# Patient Record
Sex: Male | Born: 1955 | Race: White | Hispanic: No | Marital: Single | Smoking: Former smoker
Health system: Southern US, Community
[De-identification: ages and names within clinical notes are randomized; demographics above are authoritative.]

## PROBLEM LIST (undated history)

## (undated) DIAGNOSIS — F209 Schizophrenia, unspecified: Secondary | ICD-10-CM

## (undated) DIAGNOSIS — F32A Depression, unspecified: Secondary | ICD-10-CM

## (undated) DIAGNOSIS — F329 Major depressive disorder, single episode, unspecified: Secondary | ICD-10-CM

## (undated) DIAGNOSIS — E785 Hyperlipidemia, unspecified: Secondary | ICD-10-CM

## (undated) HISTORY — DX: Schizophrenia, unspecified: F20.9

## (undated) HISTORY — DX: Major depressive disorder, single episode, unspecified: F32.9

## (undated) HISTORY — DX: Depression, unspecified: F32.A

## (undated) HISTORY — DX: Hyperlipidemia, unspecified: E78.5

## (undated) HISTORY — PX: OTHER SURGICAL HISTORY: SHX169

## (undated) HISTORY — PX: COLONOSCOPY: SHX174

---

## 2007-01-25 ENCOUNTER — Ambulatory Visit: Payer: Self-pay | Admitting: Internal Medicine

## 2007-02-17 ENCOUNTER — Ambulatory Visit: Payer: Self-pay | Admitting: Internal Medicine

## 2007-02-17 ENCOUNTER — Encounter: Payer: Self-pay | Admitting: Internal Medicine

## 2007-02-17 DIAGNOSIS — D126 Benign neoplasm of colon, unspecified: Secondary | ICD-10-CM

## 2007-02-17 DIAGNOSIS — K5730 Diverticulosis of large intestine without perforation or abscess without bleeding: Secondary | ICD-10-CM

## 2007-09-28 DIAGNOSIS — F411 Generalized anxiety disorder: Secondary | ICD-10-CM | POA: Insufficient documentation

## 2007-09-28 DIAGNOSIS — E7800 Pure hypercholesterolemia, unspecified: Secondary | ICD-10-CM | POA: Insufficient documentation

## 2007-09-28 DIAGNOSIS — F32A Depression, unspecified: Secondary | ICD-10-CM | POA: Insufficient documentation

## 2007-09-28 DIAGNOSIS — F329 Major depressive disorder, single episode, unspecified: Secondary | ICD-10-CM

## 2007-09-28 DIAGNOSIS — Z8659 Personal history of other mental and behavioral disorders: Secondary | ICD-10-CM

## 2010-02-03 ENCOUNTER — Encounter (INDEPENDENT_AMBULATORY_CARE_PROVIDER_SITE_OTHER): Payer: Self-pay | Admitting: *Deleted

## 2010-07-22 NOTE — Letter (Signed)
Summary: Colonoscopy Letter  [facility]  [address] [place], [place] [address]   Phone: [phone]  Fax: [phone]      February 03, 2010 MRN: [id number]   [facility] [address] [place], [place]  [address]   Dear Mr. Frisbie,   According to your medical record, it is time for you to schedule a Colonoscopy. The American Cancer Society recommends this procedure as a method to detect early colon cancer. Patients with a family history of colon cancer, or a personal history of colon polyps or inflammatory bowel disease are at increased risk.  This letter has beeen generated based on the recommendations made at the time of your procedure. If you feel that in your particular situation this may no longer apply, please contact our office.  Please call our office at [phone] to schedule this appointment or to update your records at your earliest convenience.  Thank you for cooperating with [place] to provide you with the very best care possible.   Sincerely,   Iva Boop, M.D.  [facility] [phone]

## 2012-08-31 ENCOUNTER — Ambulatory Visit: Payer: Medicare Other | Attending: Podiatry | Admitting: Physical Therapy

## 2012-08-31 DIAGNOSIS — M25579 Pain in unspecified ankle and joints of unspecified foot: Secondary | ICD-10-CM | POA: Insufficient documentation

## 2012-08-31 DIAGNOSIS — R5381 Other malaise: Secondary | ICD-10-CM | POA: Insufficient documentation

## 2012-08-31 DIAGNOSIS — IMO0001 Reserved for inherently not codable concepts without codable children: Secondary | ICD-10-CM | POA: Insufficient documentation

## 2012-09-02 ENCOUNTER — Ambulatory Visit: Payer: Medicare Other | Admitting: Physical Therapy

## 2012-09-05 ENCOUNTER — Ambulatory Visit: Payer: Medicare Other | Admitting: Physical Therapy

## 2012-09-07 ENCOUNTER — Ambulatory Visit: Payer: Medicare Other | Admitting: Physical Therapy

## 2012-09-12 ENCOUNTER — Ambulatory Visit: Payer: Medicare Other | Admitting: *Deleted

## 2012-09-14 ENCOUNTER — Ambulatory Visit: Payer: Medicare Other | Admitting: *Deleted

## 2012-11-18 ENCOUNTER — Ambulatory Visit (INDEPENDENT_AMBULATORY_CARE_PROVIDER_SITE_OTHER): Payer: Medicare Other | Admitting: Physician Assistant

## 2012-11-18 ENCOUNTER — Encounter: Payer: Self-pay | Admitting: Physician Assistant

## 2012-11-18 VITALS — BP 116/81 | HR 78 | Temp 96.5°F | Ht 67.0 in | Wt 196.0 lb

## 2012-11-18 DIAGNOSIS — H6120 Impacted cerumen, unspecified ear: Secondary | ICD-10-CM

## 2012-11-18 DIAGNOSIS — H6123 Impacted cerumen, bilateral: Secondary | ICD-10-CM

## 2012-11-18 NOTE — Patient Instructions (Signed)
Cerumen Plug A cerumen plug is having too much wax in your ear canal. The outer ear canal is lined with hairs and glands that secrete wax. This wax is called cerumen. This protects the ear canal. It also helps prevent material from entering the ear. Too much wax can cause a feeling of fullness in the ears, decreased hearing, ringing in the ears, or an earache. Sometimes your caregiver will remove a cerumen plug with an instrument called a curette. Or he/she may flush the ear canal with warm water from a syringe to remove the wax. You may simply be sent home to follow the home care instructions below for wax removal. Generally ear wax does not have to be removed unless it is causing a problem such as one of those listed above. When too much wax is causing a problem, the following are a few home remedies which can be used to help this problem. HOME CARE INSTRUCTIONS   Put a couple drops of glycerin, baby oil, or mineral oil in the ear a couple times of day. Do this every day for several days. After putting the drops in, you will need to lay with the affected ear pointing up for a couple minutes. This allows the drops to remain in the canal and run down to the area of wax blockage. This will soften the wax plug. It may also make your hearing worse as the wax softens and blocks the canal even more.  After a couple days, you may gently flush the ear canal with warm water from a syringe. Do this by pulling your ear up and back with your head tilted slightly forward and towards a pan to catch the water. This is most easily done with a helper. You can also accomplish the same thing by letting the shower beat into your ear canal to wash the wax out. Sometimes this will not be immediately successful. You will have to return to the first step of using the oil to further soften the wax. Then resume washing the ear canal out with a syringe or shower.  Following removal of the wax, put ten to twenty drops of rubbing  alcohol into the outer ears. This will dry the canal and prevent an infection.  Do not irrigate or wash out your ears if you have had a perforated ear drum or mastoid surgery. SEEK IMMEDIATE MEDICAL CARE IF:   You are unsuccessful with the above instructions for home care.  You develop ear pain or drainage from the ear. MAKE SURE YOU:   Understand these instructions.  Will watch your condition.  Will get help right away if you are not doing well or get worse. Document Released: 03/03/2001 Document Revised: 08/31/2011 Document Reviewed: 05/30/2008 ExitCare Patient Information 2014 [employer].  

## 2012-11-18 NOTE — Progress Notes (Signed)
Subjective:     Patient ID: FIN HUPP, male   DOB: 1956-06-14, 57 y.o.   MRN: [id number]  HPI Pt with fullness and decreased hearing bilat Denies pain or drainage from the ears + Hx of cerumen impaction  Review of Systems  All other systems reviewed and are negative.       Objective:   Physical Exam  Nursing note and vitals reviewed.  Ext ear nl bilat Both canals completely impacted with cerumen Both ears irrigated with removal of large cerumen plugs Following irrigation canals/TM's nl bilat    Assessment:     1. Impacted cerumen of both ears        Plan:     No Qtip use OTC ear wax softener monthly F/U prn

## 2012-12-07 ENCOUNTER — Other Ambulatory Visit (INDEPENDENT_AMBULATORY_CARE_PROVIDER_SITE_OTHER): Payer: Medicare Other

## 2012-12-07 DIAGNOSIS — R5381 Other malaise: Secondary | ICD-10-CM

## 2012-12-07 DIAGNOSIS — F209 Schizophrenia, unspecified: Secondary | ICD-10-CM

## 2012-12-07 DIAGNOSIS — R5383 Other fatigue: Secondary | ICD-10-CM

## 2012-12-07 DIAGNOSIS — R7989 Other specified abnormal findings of blood chemistry: Secondary | ICD-10-CM

## 2012-12-07 DIAGNOSIS — Z79899 Other long term (current) drug therapy: Secondary | ICD-10-CM

## 2012-12-07 DIAGNOSIS — E785 Hyperlipidemia, unspecified: Secondary | ICD-10-CM

## 2012-12-07 LAB — CBC WITH DIFFERENTIAL/PLATELET
Basophils Absolute: 0.1 10*3/uL (ref 0.0–0.1)
Eosinophils Absolute: 0.1 10*3/uL (ref 0.0–0.7)
Eosinophils Relative: 2 % (ref 0–5)
Lymphs Abs: 1.2 10*3/uL (ref 0.7–4.0)
MCH: 30 pg (ref 26.0–34.0)
MCHC: 35.6 g/dL (ref 30.0–36.0)
MCV: 84.2 fL (ref 78.0–100.0)
Platelets: 190 10*3/uL (ref 150–400)
RDW: 14.4 % (ref 11.5–15.5)

## 2012-12-07 LAB — COMPREHENSIVE METABOLIC PANEL
ALT: 17 U/L (ref 0–53)
CO2: 28 mEq/L (ref 19–32)
Creat: 1.36 mg/dL — ABNORMAL HIGH (ref 0.50–1.35)
Total Bilirubin: 0.6 mg/dL (ref 0.3–1.2)

## 2012-12-07 LAB — THYROID PANEL WITH TSH: T3 Uptake: 42.1 % — ABNORMAL HIGH (ref 22.5–37.0)

## 2012-12-07 LAB — LIPID PANEL
Cholesterol: 180 mg/dL (ref 0–200)
Total CHOL/HDL Ratio: 4.9 Ratio
VLDL: 45 mg/dL — ABNORMAL HIGH (ref 0–40)

## 2012-12-07 NOTE — Progress Notes (Signed)
Labs ordered by dr.lisa poulos @ [facility] fax [phone]

## 2012-12-08 LAB — URINALYSIS
Bilirubin Urine: NEGATIVE
Hgb urine dipstick: NEGATIVE
Ketones, ur: NEGATIVE mg/dL
Protein, ur: NEGATIVE mg/dL
Urobilinogen, UA: 0.2 mg/dL (ref 0.0–1.0)

## 2013-12-12 ENCOUNTER — Other Ambulatory Visit (INDEPENDENT_AMBULATORY_CARE_PROVIDER_SITE_OTHER): Payer: Medicare Other

## 2013-12-12 DIAGNOSIS — E7800 Pure hypercholesterolemia, unspecified: Secondary | ICD-10-CM

## 2013-12-12 DIAGNOSIS — Z79899 Other long term (current) drug therapy: Secondary | ICD-10-CM

## 2013-12-12 NOTE — Progress Notes (Signed)
Pt came in for labs for Dr. Noemi Chapel fax [phone]

## 2013-12-13 LAB — VITAMIN B12: VITAMIN B 12: 894 pg/mL (ref 211–946)

## 2013-12-13 LAB — CBC WITH DIFFERENTIAL
BASOS ABS: 0.1 10*3/uL (ref 0.0–0.2)
Basos: 2 %
Eos: 3 %
Eosinophils Absolute: 0.2 10*3/uL (ref 0.0–0.4)
HCT: 49.1 % (ref 37.5–51.0)
Hemoglobin: 16.7 g/dL (ref 12.6–17.7)
IMMATURE GRANS (ABS): 0 10*3/uL (ref 0.0–0.1)
IMMATURE GRANULOCYTES: 0 %
LYMPHS ABS: 1.3 10*3/uL (ref 0.7–3.1)
LYMPHS: 20 %
MCH: 29.9 pg (ref 26.6–33.0)
MCHC: 34 g/dL (ref 31.5–35.7)
MCV: 88 fL (ref 79–97)
MONOCYTES: 8 %
Monocytes Absolute: 0.5 10*3/uL (ref 0.1–0.9)
NEUTROS PCT: 67 %
Neutrophils Absolute: 4.5 10*3/uL (ref 1.4–7.0)
PLATELETS: 228 10*3/uL (ref 150–379)
RBC: 5.58 x10E6/uL (ref 4.14–5.80)
RDW: 14.4 % (ref 12.3–15.4)
WBC: 6.6 10*3/uL (ref 3.4–10.8)

## 2013-12-13 LAB — CMP14+EGFR
ALBUMIN: 4.5 g/dL (ref 3.5–5.5)
ALK PHOS: 91 IU/L (ref 39–117)
ALT: 24 IU/L (ref 0–44)
AST: 19 IU/L (ref 0–40)
Albumin/Globulin Ratio: 2 (ref 1.1–2.5)
BUN / CREAT RATIO: 11 (ref 9–20)
BUN: 15 mg/dL (ref 6–24)
CALCIUM: 9.2 mg/dL (ref 8.7–10.2)
CHLORIDE: 97 mmol/L (ref 97–108)
CO2: 26 mmol/L (ref 18–29)
Creatinine, Ser: 1.35 mg/dL — ABNORMAL HIGH (ref 0.76–1.27)
GFR calc Af Amer: 67 mL/min/{1.73_m2} (ref >59)
GFR calc non Af Amer: 58 mL/min/{1.73_m2} — ABNORMAL LOW (ref >59)
Globulin, Total: 2.3 g/dL (ref 1.5–4.5)
Glucose: 93 mg/dL (ref 65–99)
Potassium: 4.7 mmol/L (ref 3.5–5.2)
Sodium: 139 mmol/L (ref 134–144)
Total Bilirubin: 0.5 mg/dL (ref 0.0–1.2)
Total Protein: 6.8 g/dL (ref 6.0–8.5)

## 2013-12-13 LAB — LIPID PANEL
CHOLESTEROL TOTAL: 216 mg/dL — AB (ref 100–199)
Chol/HDL Ratio: 6.2 ratio units — ABNORMAL HIGH (ref 0.0–5.0)
HDL: 35 mg/dL — ABNORMAL LOW (ref >39)
LDL CALC: 116 mg/dL — AB (ref 0–99)
TRIGLYCERIDES: 326 mg/dL — AB (ref 0–149)
VLDL Cholesterol Cal: 65 mg/dL — ABNORMAL HIGH (ref 5–40)

## 2013-12-13 LAB — LDL CHOLESTEROL, DIRECT: LDL DIRECT: 130 mg/dL — AB (ref 0–99)

## 2013-12-13 LAB — THYROID PANEL WITH TSH
FREE THYROXINE INDEX: 2.3 (ref 1.2–4.9)
T3 UPTAKE RATIO: 31 % (ref 24–39)
T4, Total: 7.3 ug/dL (ref 4.5–12.0)
TSH: 3.01 u[IU]/mL (ref 0.450–4.500)

## 2013-12-13 LAB — VITAMIN D 25 HYDROXY (VIT D DEFICIENCY, FRACTURES): Vit D, 25-Hydroxy: 25 ng/mL — ABNORMAL LOW (ref 30.0–100.0)

## 2017-02-10 ENCOUNTER — Other Ambulatory Visit: Payer: Self-pay

## 2019-04-03 ENCOUNTER — Ambulatory Visit: Payer: Self-pay | Admitting: Family Medicine

## 2019-05-03 ENCOUNTER — Other Ambulatory Visit: Payer: Self-pay

## 2019-05-04 ENCOUNTER — Ambulatory Visit (INDEPENDENT_AMBULATORY_CARE_PROVIDER_SITE_OTHER): Payer: Medicare Other | Admitting: Family Medicine

## 2019-05-04 ENCOUNTER — Encounter: Payer: Self-pay | Admitting: Family Medicine

## 2019-05-04 VITALS — BP 115/79 | HR 77 | Temp 97.4°F | Ht 67.0 in | Wt 181.4 lb

## 2019-05-04 DIAGNOSIS — Z1159 Encounter for screening for other viral diseases: Secondary | ICD-10-CM

## 2019-05-04 DIAGNOSIS — Z125 Encounter for screening for malignant neoplasm of prostate: Secondary | ICD-10-CM

## 2019-05-04 DIAGNOSIS — K5730 Diverticulosis of large intestine without perforation or abscess without bleeding: Secondary | ICD-10-CM

## 2019-05-04 DIAGNOSIS — F250 Schizoaffective disorder, bipolar type: Secondary | ICD-10-CM

## 2019-05-04 DIAGNOSIS — Z0001 Encounter for general adult medical examination with abnormal findings: Secondary | ICD-10-CM | POA: Diagnosis not present

## 2019-05-04 DIAGNOSIS — E7800 Pure hypercholesterolemia, unspecified: Secondary | ICD-10-CM

## 2019-05-04 DIAGNOSIS — Z114 Encounter for screening for human immunodeficiency virus [HIV]: Secondary | ICD-10-CM

## 2019-05-04 DIAGNOSIS — E559 Vitamin D deficiency, unspecified: Secondary | ICD-10-CM

## 2019-05-04 NOTE — Progress Notes (Signed)
Established Patient Office Visit  Subjective:  Patient ID: Kyle Arellano, male    DOB: Jan 17, 1956  Age: 63 y.o. MRN: [id number]  CC:  Chief Complaint  Patient presents with  . New Patient (Initial Visit)  . Establish Care    HPI Kyle Arellano presents for NEw patient evaluation and check up he is under the care of psychiatry, Dr. Noemi Chapel for schizoaffective disorder. Denies any current issues. She manages his medication  Past Medical History:  Diagnosis Date  . Depression   . Hyperlipidemia   . Schizophrenia ([place])     History reviewed. No pertinent surgical history.  Family History  Problem Relation Age of Onset  . Hypertension Mother   . Cancer Father   . Cirrhosis Father   . Diabetes Father     Social History   Socioeconomic History  . Marital status: Single    Spouse name: Not on file  . Number of children: Not on file  . Years of education: Not on file  . Highest education level: Not on file  Occupational History  . Not on file  Social Needs  . Financial resource strain: Not on file  . Food insecurity    Worry: Not on file    Inability: Not on file  . Transportation needs    Medical: Not on file    Non-medical: Not on file  Tobacco Use  . Smoking status: Former Research scientist (life sciences)  . Smokeless tobacco: Never Used  Substance and Sexual Activity  . Alcohol use: Never    Frequency: Never  . Drug use: Never  . Sexual activity: Not on file  Lifestyle  . Physical activity    Days per week: Not on file    Minutes per session: Not on file  . Stress: Not on file  Relationships  . Social Herbalist on phone: Not on file    Gets together: Not on file    Attends religious service: Not on file    Active member of club or organization: Not on file    Attends meetings of clubs or organizations: Not on file    Relationship status: Not on file  . Intimate partner violence    Fear of current or ex partner: Not on file    Emotionally abused: Not on file     Physically abused: Not on file    Forced sexual activity: Not on file  Other Topics Concern  . Not on file  Social History Narrative  . Not on file    Outpatient Medications Prior to Visit  Medication Sig Dispense Refill  . benztropine (COGENTIN) 2 MG tablet Take 2 mg by mouth daily.    Marland Kitchen buPROPion (WELLBUTRIN SR) 150 MG 12 hr tablet Take 150 mg by mouth 2 (two) times daily.    . fluPHENAZine (PROLIXIN) 10 MG tablet Take 10 mg by mouth every morning.    . TRINTELLIX 10 MG TABS tablet Take 10 mg by mouth every morning.    . DULoxetine (CYMBALTA) 30 MG capsule Take 30 mg by mouth daily.    . fluPHENAZine (PROLIXIN) 5 MG tablet Take 2.5 mg by mouth 2 (two) times daily.    . temazepam (RESTORIL) 30 MG capsule Take 30 mg by mouth 2 (two) times daily as needed for sleep.     No facility-administered medications prior to visit.     No Known Allergies  ROS Review of Systems  Constitutional: Negative.   HENT: Negative.  Eyes: Negative for visual disturbance.  Respiratory: Negative for cough and shortness of breath.   Cardiovascular: Negative for chest pain and leg swelling.  Gastrointestinal: Negative for abdominal pain, diarrhea, nausea and vomiting.  Genitourinary: Negative for difficulty urinating.  Musculoskeletal: Negative for arthralgias and myalgias.  Skin: Negative for rash.  Neurological: Negative for headaches.  Psychiatric/Behavioral: Negative for sleep disturbance.      Objective:    Physical Exam  Constitutional: He is oriented to person, place, and time. He appears well-developed and well-nourished.  HENT:  Head: Normocephalic and atraumatic.  Mouth/Throat: Oropharynx is clear and moist.  Eyes: Pupils are equal, round, and reactive to light. EOM are normal.  Neck: Normal range of motion. No tracheal deviation present. No thyromegaly present.  Cardiovascular: Normal rate, regular rhythm and normal heart sounds. Exam reveals no gallop and no friction rub.  No  murmur heard. Pulmonary/Chest: Breath sounds normal. He has no wheezes. He has no rales.  Abdominal: Soft. He exhibits no mass. There is no abdominal tenderness.  Musculoskeletal: Normal range of motion.        General: No edema.  Neurological: He is alert and oriented to person, place, and time.  Skin: Skin is warm and dry.  Psychiatric: He has a normal mood and affect.    BP 115/79   Pulse 77   Temp (!) 97.4 F (36.3 C) (Temporal)   Ht 5\' 7"  (1.702 m)   Wt 181 lb 6.4 oz (82.3 kg)   SpO2 99%   BMI 28.41 kg/m  Wt Readings from Last 3 Encounters:  05/04/19 181 lb 6.4 oz (82.3 kg)  11/18/12 196 lb (88.9 kg)     Health Maintenance Due  Topic Date Due  . Hepatitis C Screening  02-21-1956  . HIV Screening  09/21/1971  . TETANUS/TDAP  09/21/1975  . COLONOSCOPY  02/16/2017    There are no preventive care reminders to display for this patient.  Lab Results  Component Value Date   TSH 3.010 12/12/2013   Lab Results  Component Value Date   WBC 6.6 12/12/2013   HGB 16.7 12/12/2013   HCT 49.1 12/12/2013   MCV 88 12/12/2013   PLT 228 12/12/2013   Lab Results  Component Value Date   NA 139 12/12/2013   K 4.7 12/12/2013   CO2 26 12/12/2013   GLUCOSE 93 12/12/2013   BUN 15 12/12/2013   CREATININE 1.35 (H) 12/12/2013   BILITOT 0.5 12/12/2013   ALKPHOS 91 12/12/2013   AST 19 12/12/2013   ALT 24 12/12/2013   PROT 6.8 12/12/2013   ALBUMIN 4.5 12/12/2013   CALCIUM 9.2 12/12/2013   Lab Results  Component Value Date   CHOL 216 (H) 12/12/2013   Lab Results  Component Value Date   HDL 35 (L) 12/12/2013   Lab Results  Component Value Date   LDLCALC 116 (H) 12/12/2013   Lab Results  Component Value Date   TRIG 326 (H) 12/12/2013   Lab Results  Component Value Date   CHOLHDL 6.2 (H) 12/12/2013   No results found for: HGBA1C    Assessment & Plan:   Problem List Items Addressed This Visit      Active Problems   HYPERCHOLESTEROLEMIA   Relevant Orders    CBC   CMP   Lipid   Diverticulosis of colon   Relevant Orders   CBC   CMP   Lipid    Other Visit Diagnoses    Schizoaffective disorder, bipolar type ([place])    -  Primary   Relevant Orders   CBC   Lipid   Need for hepatitis C screening test       Relevant Orders   Hepatitis C antibody   Screening for prostate cancer       Relevant Orders   PSA Total (Reflex To Free)   Vitamin D deficiency       Relevant Orders   VITAMIN D   Encounter for screening for HIV       Relevant Orders   HIV antibody      No orders of the defined types were placed in this encounter.   Follow-up: Return in about 1 year (around 05/03/2020).    Claretta Fraise, MD

## 2019-05-05 LAB — LIPID PANEL
Chol/HDL Ratio: 4.2 ratio (ref 0.0–5.0)
Cholesterol, Total: 181 mg/dL (ref 100–199)
HDL: 43 mg/dL (ref >39)
LDL Chol Calc (NIH): 109 mg/dL — ABNORMAL HIGH (ref 0–99)
Triglycerides: 164 mg/dL — ABNORMAL HIGH (ref 0–149)
VLDL Cholesterol Cal: 29 mg/dL (ref 5–40)

## 2019-05-05 LAB — CBC WITH DIFFERENTIAL/PLATELET
Basophils Absolute: 0.1 10*3/uL (ref 0.0–0.2)
Basos: 2 %
EOS (ABSOLUTE): 0.1 10*3/uL (ref 0.0–0.4)
Eos: 2 %
Hematocrit: 48.7 % (ref 37.5–51.0)
Hemoglobin: 16.4 g/dL (ref 13.0–17.7)
Immature Grans (Abs): 0 10*3/uL (ref 0.0–0.1)
Immature Granulocytes: 0 %
Lymphocytes Absolute: 1.3 10*3/uL (ref 0.7–3.1)
Lymphs: 18 %
MCH: 29.1 pg (ref 26.6–33.0)
MCHC: 33.7 g/dL (ref 31.5–35.7)
MCV: 87 fL (ref 79–97)
Monocytes Absolute: 0.5 10*3/uL (ref 0.1–0.9)
Monocytes: 8 %
Neutrophils Absolute: 5.1 10*3/uL (ref 1.4–7.0)
Neutrophils: 70 %
Platelets: 223 10*3/uL (ref 150–450)
RBC: 5.63 x10E6/uL (ref 4.14–5.80)
RDW: 14 % (ref 11.6–15.4)
WBC: 7.2 10*3/uL (ref 3.4–10.8)

## 2019-05-05 LAB — CMP14+EGFR
ALT: 31 IU/L (ref 0–44)
AST: 21 IU/L (ref 0–40)
Albumin/Globulin Ratio: 2.2 (ref 1.2–2.2)
Albumin: 4.6 g/dL (ref 3.8–4.8)
Alkaline Phosphatase: 72 IU/L (ref 39–117)
BUN/Creatinine Ratio: 16 (ref 10–24)
BUN: 20 mg/dL (ref 8–27)
Bilirubin Total: 0.6 mg/dL (ref 0.0–1.2)
CO2: 25 mmol/L (ref 20–29)
Calcium: 9.6 mg/dL (ref 8.6–10.2)
Chloride: 101 mmol/L (ref 96–106)
Creatinine, Ser: 1.24 mg/dL (ref 0.76–1.27)
GFR calc Af Amer: 72 mL/min/{1.73_m2} (ref >59)
GFR calc non Af Amer: 62 mL/min/{1.73_m2} (ref >59)
Globulin, Total: 2.1 g/dL (ref 1.5–4.5)
Glucose: 81 mg/dL (ref 65–99)
Potassium: 4.4 mmol/L (ref 3.5–5.2)
Sodium: 140 mmol/L (ref 134–144)
Total Protein: 6.7 g/dL (ref 6.0–8.5)

## 2019-05-05 LAB — HIV ANTIBODY (ROUTINE TESTING W REFLEX): HIV Screen 4th Generation wRfx: NONREACTIVE

## 2019-05-05 LAB — PSA TOTAL (REFLEX TO FREE): Prostate Specific Ag, Serum: 0.3 ng/mL (ref 0.0–4.0)

## 2019-05-05 LAB — HEPATITIS C ANTIBODY: Hep C Virus Ab: <0.1 s/co ratio (ref 0.0–0.9)

## 2019-05-05 LAB — VITAMIN D 25 HYDROXY (VIT D DEFICIENCY, FRACTURES): Vit D, 25-Hydroxy: 61.4 ng/mL (ref 30.0–100.0)

## 2019-05-08 NOTE — Progress Notes (Signed)
Hello Loring,  Your lab result is normal and/or stable.Some minor variations that are not significant are commonly marked abnormal, but do not represent any medical problem for you.  Best regards, Katelynn Heidler, M.D.

## 2019-11-12 DIAGNOSIS — H6120 Impacted cerumen, unspecified ear: Secondary | ICD-10-CM | POA: Diagnosis not present

## 2020-05-01 DIAGNOSIS — H1013 Acute atopic conjunctivitis, bilateral: Secondary | ICD-10-CM | POA: Diagnosis not present

## 2020-05-01 DIAGNOSIS — H40033 Anatomical narrow angle, bilateral: Secondary | ICD-10-CM | POA: Diagnosis not present

## 2020-09-02 ENCOUNTER — Encounter: Payer: Self-pay | Admitting: Family Medicine

## 2020-09-02 ENCOUNTER — Ambulatory Visit (INDEPENDENT_AMBULATORY_CARE_PROVIDER_SITE_OTHER): Payer: Medicare Other | Admitting: Family Medicine

## 2020-09-02 ENCOUNTER — Other Ambulatory Visit: Payer: Self-pay

## 2020-09-02 VITALS — BP 111/76 | HR 88 | Temp 98.2°F | Ht 67.0 in | Wt 200.6 lb

## 2020-09-02 DIAGNOSIS — Z79899 Other long term (current) drug therapy: Secondary | ICD-10-CM

## 2020-09-02 DIAGNOSIS — Z125 Encounter for screening for malignant neoplasm of prostate: Secondary | ICD-10-CM

## 2020-09-02 DIAGNOSIS — Z0000 Encounter for general adult medical examination without abnormal findings: Secondary | ICD-10-CM

## 2020-09-02 LAB — BAYER DCA HB A1C WAIVED: HB A1C (BAYER DCA - WAIVED): 5.4 % (ref <7.0)

## 2020-09-02 NOTE — Progress Notes (Signed)
Subjective:  Patient ID: Kyle Arellano, male    DOB: 08-23-1955  Age: 65 y.o. MRN: [id number]  CC: Annual Exam   HPI  Kyle Arellano presents for Annual Physical.   Depression screen Kyle Arellano 2/9 09/02/2020 05/04/2019  Decreased Interest 0 0  Down, Depressed, Hopeless 1 1  PHQ - 2 Score 1 1  Altered sleeping 0 -  Tired, decreased energy 0 -  Change in appetite 0 -  Feeling bad or failure about yourself  0 -  Trouble concentrating 2 -  Moving slowly or fidgety/restless 0 -  Suicidal thoughts 0 -  PHQ-9 Score 3 -  Difficult doing work/chores Not difficult at all -    History Kyle Arellano has a past medical history of Depression, Hyperlipidemia, and Schizophrenia ([place]).   Kyle Arellano has no past surgical history on file.   His family history includes Cancer in his father; Cirrhosis in his father; Diabetes in his father; Hypertension in his mother.Kyle Arellano reports that Kyle Arellano has quit smoking. Kyle Arellano has never used smokeless tobacco. Kyle Arellano reports that Kyle Arellano does not drink alcohol and does not use drugs.    ROS Review of Systems  Neurological: Positive for headaches (near daily. Relief with a single dose of ibuprofen. bilateral frontoparietal aching snesation. No visual or other  neuro sx accompany).    Objective:  BP 111/76   Pulse 88   Temp 98.2 F (36.8 C)   Ht $R'5\' 7"'Sw$  (1.702 m)   Wt 200 lb 9.6 oz (91 kg)   SpO2 96%   BMI 31.42 kg/m   BP Readings from Last 3 Encounters:  09/02/20 111/76  05/04/19 115/79  11/18/12 116/81    Wt Readings from Last 3 Encounters:  09/02/20 200 lb 9.6 oz (91 kg)  05/04/19 181 lb 6.4 oz (82.3 kg)  11/18/12 196 lb (88.9 kg)     Physical Exam Constitutional:      Appearance: Kyle Arellano is well-developed.  HENT:     Head: Normocephalic and atraumatic.  Eyes:     Pupils: Pupils are equal, round, and reactive to light.  Neck:     Thyroid: No thyromegaly.     Trachea: No tracheal deviation.  Cardiovascular:     Rate and Rhythm: Normal rate and regular rhythm.     Heart  sounds: Normal heart sounds. No murmur heard. No friction rub. No gallop.   Pulmonary:     Breath sounds: Normal breath sounds. No wheezing or rales.  Abdominal:     General: Bowel sounds are normal. There is no distension.     Palpations: Abdomen is soft. There is no mass.     Tenderness: There is no abdominal tenderness.     Hernia: There is no hernia in the left inguinal area.  Genitourinary:    Penis: Normal.      Testes: Normal.  Musculoskeletal:        General: Normal range of motion.     Cervical back: Normal range of motion.  Lymphadenopathy:     Cervical: No cervical adenopathy.  Skin:    General: Skin is warm and dry.  Neurological:     Mental Status: Kyle Arellano is alert and oriented to person, place, and time.       Assessment & Plan:   Kyle Arellano was seen today for annual exam.  Diagnoses and all orders for this visit:  Well adult exam -     CBC with Differential/Platelet -     CMP14+EGFR -     Lipid  panel  High risk medication use -     CBC with Differential/Platelet -     CMP14+EGFR -     Bayer DCA Hb A1c Waived -     Lipid panel -     LDL Cholesterol, Direct -     Thyroid Panel With TSH -     VITAMIN D 25 Hydroxy (Vit-D Deficiency, Fractures) -     Vitamin B12  Screening for prostate cancer -     PSA, total and free       I am having Kyle Arellano maintain his benztropine, buPROPion, Trintellix, and fluPHENAZine.  Allergies as of 09/02/2020   No Known Allergies     Medication List       Accurate as of September 02, 2020  2:02 PM. If you have any questions, ask your nurse or doctor.        benztropine 2 MG tablet Commonly known as: COGENTIN Take 2 mg by mouth daily.   buPROPion 150 MG 12 hr tablet Commonly known as: WELLBUTRIN SR Take 150 mg by mouth 2 (two) times daily.   fluPHENAZine 10 MG tablet Commonly known as: PROLIXIN Take 10 mg by mouth every morning.   Trintellix 10 MG Tabs tablet Generic drug: vortioxetine HBr Take 10 mg by  mouth every morning.      Followed By Psychiatry, Alfonzo Beers for meds above.  Follow-up: No follow-ups on file.  Claretta Fraise, M.D.

## 2020-09-02 NOTE — Progress Notes (Signed)
Hello Demarco,  Your lab result is normal and/or stable.Some minor variations that are not significant are commonly marked abnormal, but do not represent any medical problem for you.  Best regards, Husain Costabile, M.D.

## 2020-09-03 LAB — PSA, TOTAL AND FREE
PSA, Free Pct: 40 %
PSA, Free: 0.12 ng/mL
Prostate Specific Ag, Serum: 0.3 ng/mL (ref 0.0–4.0)

## 2020-09-03 LAB — CMP14+EGFR
ALT: 34 IU/L (ref 0–44)
AST: 27 IU/L (ref 0–40)
Albumin/Globulin Ratio: 2.4 — ABNORMAL HIGH (ref 1.2–2.2)
Albumin: 4.7 g/dL (ref 3.8–4.8)
Alkaline Phosphatase: 79 IU/L (ref 44–121)
BUN/Creatinine Ratio: 17 (ref 10–24)
BUN: 25 mg/dL (ref 8–27)
Bilirubin Total: 0.5 mg/dL (ref 0.0–1.2)
CO2: 23 mmol/L (ref 20–29)
Calcium: 9.3 mg/dL (ref 8.6–10.2)
Chloride: 101 mmol/L (ref 96–106)
Creatinine, Ser: 1.48 mg/dL — ABNORMAL HIGH (ref 0.76–1.27)
Globulin, Total: 2 g/dL (ref 1.5–4.5)
Glucose: 79 mg/dL (ref 65–99)
Potassium: 4.4 mmol/L (ref 3.5–5.2)
Sodium: 140 mmol/L (ref 134–144)
Total Protein: 6.7 g/dL (ref 6.0–8.5)
eGFR: 53 mL/min/{1.73_m2} — ABNORMAL LOW (ref >59)

## 2020-09-03 LAB — CBC WITH DIFFERENTIAL/PLATELET
Basophils Absolute: 0.2 10*3/uL (ref 0.0–0.2)
Basos: 2 %
EOS (ABSOLUTE): 0.2 10*3/uL (ref 0.0–0.4)
Eos: 2 %
Hematocrit: 51.6 % — ABNORMAL HIGH (ref 37.5–51.0)
Hemoglobin: 17.2 g/dL (ref 13.0–17.7)
Immature Grans (Abs): 0.1 10*3/uL (ref 0.0–0.1)
Immature Granulocytes: 1 %
Lymphocytes Absolute: 1.7 10*3/uL (ref 0.7–3.1)
Lymphs: 15 %
MCH: 29.7 pg (ref 26.6–33.0)
MCHC: 33.3 g/dL (ref 31.5–35.7)
MCV: 89 fL (ref 79–97)
Monocytes Absolute: 0.8 10*3/uL (ref 0.1–0.9)
Monocytes: 8 %
Neutrophils Absolute: 8 10*3/uL — ABNORMAL HIGH (ref 1.4–7.0)
Neutrophils: 72 %
Platelets: 283 10*3/uL (ref 150–450)
RBC: 5.79 x10E6/uL (ref 4.14–5.80)
RDW: 13.5 % (ref 11.6–15.4)
WBC: 11 10*3/uL — ABNORMAL HIGH (ref 3.4–10.8)

## 2020-09-03 LAB — LIPID PANEL
Chol/HDL Ratio: 5.5 ratio — ABNORMAL HIGH (ref 0.0–5.0)
Cholesterol, Total: 221 mg/dL — ABNORMAL HIGH (ref 100–199)
HDL: 40 mg/dL (ref >39)
LDL Chol Calc (NIH): 138 mg/dL — ABNORMAL HIGH (ref 0–99)
Triglycerides: 239 mg/dL — ABNORMAL HIGH (ref 0–149)
VLDL Cholesterol Cal: 43 mg/dL — ABNORMAL HIGH (ref 5–40)

## 2020-09-03 LAB — THYROID PANEL WITH TSH
Free Thyroxine Index: 2.3 (ref 1.2–4.9)
T3 Uptake Ratio: 30 % (ref 24–39)
T4, Total: 7.6 ug/dL (ref 4.5–12.0)
TSH: 1.78 u[IU]/mL (ref 0.450–4.500)

## 2020-09-03 LAB — VITAMIN B12: Vitamin B-12: 769 pg/mL (ref 232–1245)

## 2020-09-03 LAB — VITAMIN D 25 HYDROXY (VIT D DEFICIENCY, FRACTURES): Vit D, 25-Hydroxy: 48.1 ng/mL (ref 30.0–100.0)

## 2020-09-03 LAB — LDL CHOLESTEROL, DIRECT: LDL Direct: 130 mg/dL — ABNORMAL HIGH (ref 0–99)

## 2020-09-04 ENCOUNTER — Other Ambulatory Visit: Payer: Self-pay | Admitting: Family Medicine

## 2020-09-04 MED ORDER — ROSUVASTATIN CALCIUM 10 MG PO TABS: 10.0000 mg | ORAL_TABLET | Freq: Every day | ORAL | 1 refills | Status: DC

## 2020-10-24 ENCOUNTER — Encounter: Payer: Self-pay | Admitting: Family Medicine

## 2020-10-24 ENCOUNTER — Other Ambulatory Visit: Payer: Self-pay

## 2020-10-24 ENCOUNTER — Ambulatory Visit (INDEPENDENT_AMBULATORY_CARE_PROVIDER_SITE_OTHER): Payer: Medicare Other | Admitting: Family Medicine

## 2020-10-24 VITALS — BP 112/67 | HR 76 | Temp 98.3°F | Ht 67.0 in | Wt 203.4 lb

## 2020-10-24 DIAGNOSIS — H6123 Impacted cerumen, bilateral: Secondary | ICD-10-CM

## 2020-10-24 NOTE — Patient Instructions (Signed)

## 2020-10-24 NOTE — Progress Notes (Signed)
Acute Office Visit  Subjective:    Patient ID: Kyle Arellano, male    DOB: Mar 03, 1956, 65 y.o.   MRN: [id number]  Chief Complaint  Patient presents with  . Ear Fullness    HPI Patient is in today for ear fullness for 5 days. Denies drainage, pain, congestion, cough, or fever. Reports muffled hearing.   Past Medical History:  Diagnosis Date  . Depression   . Hyperlipidemia   . Schizophrenia ([facility])     History reviewed. No pertinent surgical history.  Family History  Problem Relation Age of Onset  . Hypertension Mother   . Cancer Father   . Cirrhosis Father   . Diabetes Father     Social History   Socioeconomic History  . Marital status: Single    Spouse name: Not on file  . Number of children: Not on file  . Years of education: Not on file  . Highest education level: Not on file  Occupational History  . Not on file  Tobacco Use  . Smoking status: Former Games developer  . Smokeless tobacco: Never Used  Vaping Use  . Vaping Use: Never used  Substance and Sexual Activity  . Alcohol use: Never  . Drug use: Never  . Sexual activity: Not on file  Other Topics Concern  . Not on file  Social History Narrative  . Not on file   Social Determinants of Health   Financial Resource Strain: Not on file  Food Insecurity: Not on file  Transportation Needs: Not on file  Physical Activity: Not on file  Stress: Not on file  Social Connections: Not on file  Intimate Partner Violence: Not on file    Outpatient Medications Prior to Visit  Medication Sig Dispense Refill  . benztropine (COGENTIN) 2 MG tablet Take 2 mg by mouth daily.    Marland Kitchen buPROPion (WELLBUTRIN SR) 150 MG 12 hr tablet Take 150 mg by mouth 2 (two) times daily.    . fluPHENAZine (PROLIXIN) 10 MG tablet Take 10 mg by mouth every morning.    . rosuvastatin (CRESTOR) 10 MG tablet Take 1 tablet (10 mg total) by mouth daily. For cholesterol 90 tablet 1  . TRINTELLIX 10 MG TABS tablet Take 10 mg by mouth every morning.      No facility-administered medications prior to visit.    No Known Allergies  Review of Systems As per HPI.     Objective:    Physical Exam Vitals and nursing note reviewed.  Constitutional:      General: He is not in acute distress.    Appearance: Normal appearance. He is not ill-appearing, toxic-appearing or diaphoretic.  HENT:     Head: Normocephalic and atraumatic.     Right Ear: External ear normal. There is impacted cerumen.     Left Ear: External ear normal. There is impacted cerumen.     Nose: Nose normal.  Skin:    General: Skin is warm and dry.  Neurological:     Mental Status: He is alert and oriented to person, place, and time.  Psychiatric:        Mood and Affect: Mood normal.        Behavior: Behavior normal.      BP 112/67   Pulse 76   Temp 98.3 F (36.8 C) (Temporal)   Ht 5\' 7"  (1.702 m)   Wt 203 lb 6 oz (92.3 kg)   BMI 31.85 kg/m  Wt Readings from Last 3 Encounters:  10/24/20 203  lb 6 oz (92.3 kg)  09/02/20 200 lb 9.6 oz (91 kg)  05/04/19 181 lb 6.4 oz (82.3 kg)   Ear Cerumen Removal  Date/Time: 10/24/2020 9:58 AM Performed by: Gwenlyn Perking, FNP Authorized by: Gwenlyn Perking, FNP   Anesthesia: Local Anesthetic: none Location details: right ear and left ear Patient tolerance: patient tolerated the procedure well with no immediate complications Comments: Normal TMs visualized bilaterally following procedure. Procedure type: irrigation  Sedation: Patient sedated: no     Health Maintenance Due  Topic Date Due  . COLONOSCOPY (Pts 45-69yrs Insurance coverage will need to be confirmed)  02/16/2017    There are no preventive care reminders to display for this patient.   Lab Results  Component Value Date   TSH 1.780 09/02/2020   Lab Results  Component Value Date   WBC 11.0 (H) 09/02/2020   HGB 17.2 09/02/2020   HCT 51.6 (H) 09/02/2020   MCV 89 09/02/2020   PLT 283 09/02/2020   Lab Results  Component Value Date   NA  140 09/02/2020   K 4.4 09/02/2020   CO2 23 09/02/2020   GLUCOSE 79 09/02/2020   BUN 25 09/02/2020   CREATININE 1.48 (H) 09/02/2020   BILITOT 0.5 09/02/2020   ALKPHOS 79 09/02/2020   AST 27 09/02/2020   ALT 34 09/02/2020   PROT 6.7 09/02/2020   ALBUMIN 4.7 09/02/2020   CALCIUM 9.3 09/02/2020   EGFR 53 (L) 09/02/2020   Lab Results  Component Value Date   CHOL 221 (H) 09/02/2020   Lab Results  Component Value Date   HDL 40 09/02/2020   Lab Results  Component Value Date   LDLCALC 138 (H) 09/02/2020   Lab Results  Component Value Date   TRIG 239 (H) 09/02/2020   Lab Results  Component Value Date   CHOLHDL 5.5 (H) 09/02/2020   Lab Results  Component Value Date   HGBA1C 5.4 09/02/2020       Assessment & Plan:   Krystal was seen today for ear fullness.  Diagnoses and all orders for this visit:  Bilateral impacted cerumen Bilateral ear irrigation today in office. Normal TMs visualized after. Handout given. Return to office for new or worsening symptoms, or if symptoms persist.  -     Ear Cerumen Removal  The patient indicates understanding of these issues and agrees with the plan.  Gwenlyn Perking, FNP

## 2021-03-10 ENCOUNTER — Other Ambulatory Visit: Payer: Self-pay | Admitting: Family Medicine

## 2021-03-14 ENCOUNTER — Other Ambulatory Visit: Payer: Self-pay | Admitting: Family Medicine

## 2021-04-15 ENCOUNTER — Other Ambulatory Visit: Payer: Self-pay | Admitting: Family Medicine

## 2021-04-16 NOTE — Telephone Encounter (Signed)
Stacks NTBS 30 days given 03/11/21

## 2021-05-22 ENCOUNTER — Other Ambulatory Visit: Payer: Self-pay | Admitting: Family Medicine

## 2021-05-22 NOTE — Telephone Encounter (Signed)
Stacks. NTBS 30 days given 03/11/21

## 2021-05-23 ENCOUNTER — Other Ambulatory Visit: Payer: Self-pay | Admitting: *Deleted

## 2021-05-23 MED ORDER — ROSUVASTATIN CALCIUM 10 MG PO TABS: 10.0000 mg | ORAL_TABLET | Freq: Every day | ORAL | 0 refills | Status: DC

## 2021-05-23 NOTE — Telephone Encounter (Signed)
Admin - please call pt and set up a PCP follow up for further refills - #30 of cholesterol meds sent today 05/23/21. - JHB

## 2021-05-27 NOTE — Telephone Encounter (Signed)
Called pt to schedule appt he declined and is aware no further refills will be prescribed until he is seen

## 2021-05-27 NOTE — Telephone Encounter (Signed)
Pt declined to make appt with PCP, he is aware no further refills will be prescribed until he is seen and stated he was ok

## 2021-08-25 ENCOUNTER — Ambulatory Visit (INDEPENDENT_AMBULATORY_CARE_PROVIDER_SITE_OTHER): Payer: Medicare Other | Admitting: Nurse Practitioner

## 2021-08-25 ENCOUNTER — Ambulatory Visit (HOSPITAL_COMMUNITY)
Admission: RE | Admit: 2021-08-25 | Discharge: 2021-08-25 | Disposition: A | Discharge status: Home / Self Care | Payer: Medicare Other | Source: Ambulatory Visit | Attending: Nurse Practitioner | Admitting: Nurse Practitioner

## 2021-08-25 ENCOUNTER — Other Ambulatory Visit: Payer: Self-pay

## 2021-08-25 ENCOUNTER — Other Ambulatory Visit: Payer: Self-pay | Admitting: Family Medicine

## 2021-08-25 ENCOUNTER — Telehealth: Payer: Self-pay | Admitting: Family Medicine

## 2021-08-25 ENCOUNTER — Ambulatory Visit (INDEPENDENT_AMBULATORY_CARE_PROVIDER_SITE_OTHER): Payer: Medicare Other

## 2021-08-25 ENCOUNTER — Encounter: Payer: Self-pay | Admitting: Nurse Practitioner

## 2021-08-25 VITALS — BP 128/85 | HR 93 | Temp 98.9°F | Resp 20 | Ht 67.0 in | Wt 208.0 lb

## 2021-08-25 DIAGNOSIS — R1084 Generalized abdominal pain: Secondary | ICD-10-CM

## 2021-08-25 DIAGNOSIS — R109 Unspecified abdominal pain: Secondary | ICD-10-CM | POA: Diagnosis not present

## 2021-08-25 LAB — POCT I-STAT CREATININE: Creatinine, Ser: 1.3 mg/dL — ABNORMAL HIGH (ref 0.61–1.24)

## 2021-08-25 IMAGING — DX DG ABDOMEN 1V
2 series · 2 of 2 positions shown · non-contrast
Comparison: None.

CLINICAL DATA: Abdominal pain.

EXAM:
ABDOMEN - 1 VIEW

[abdomen kub (1 of 2)]
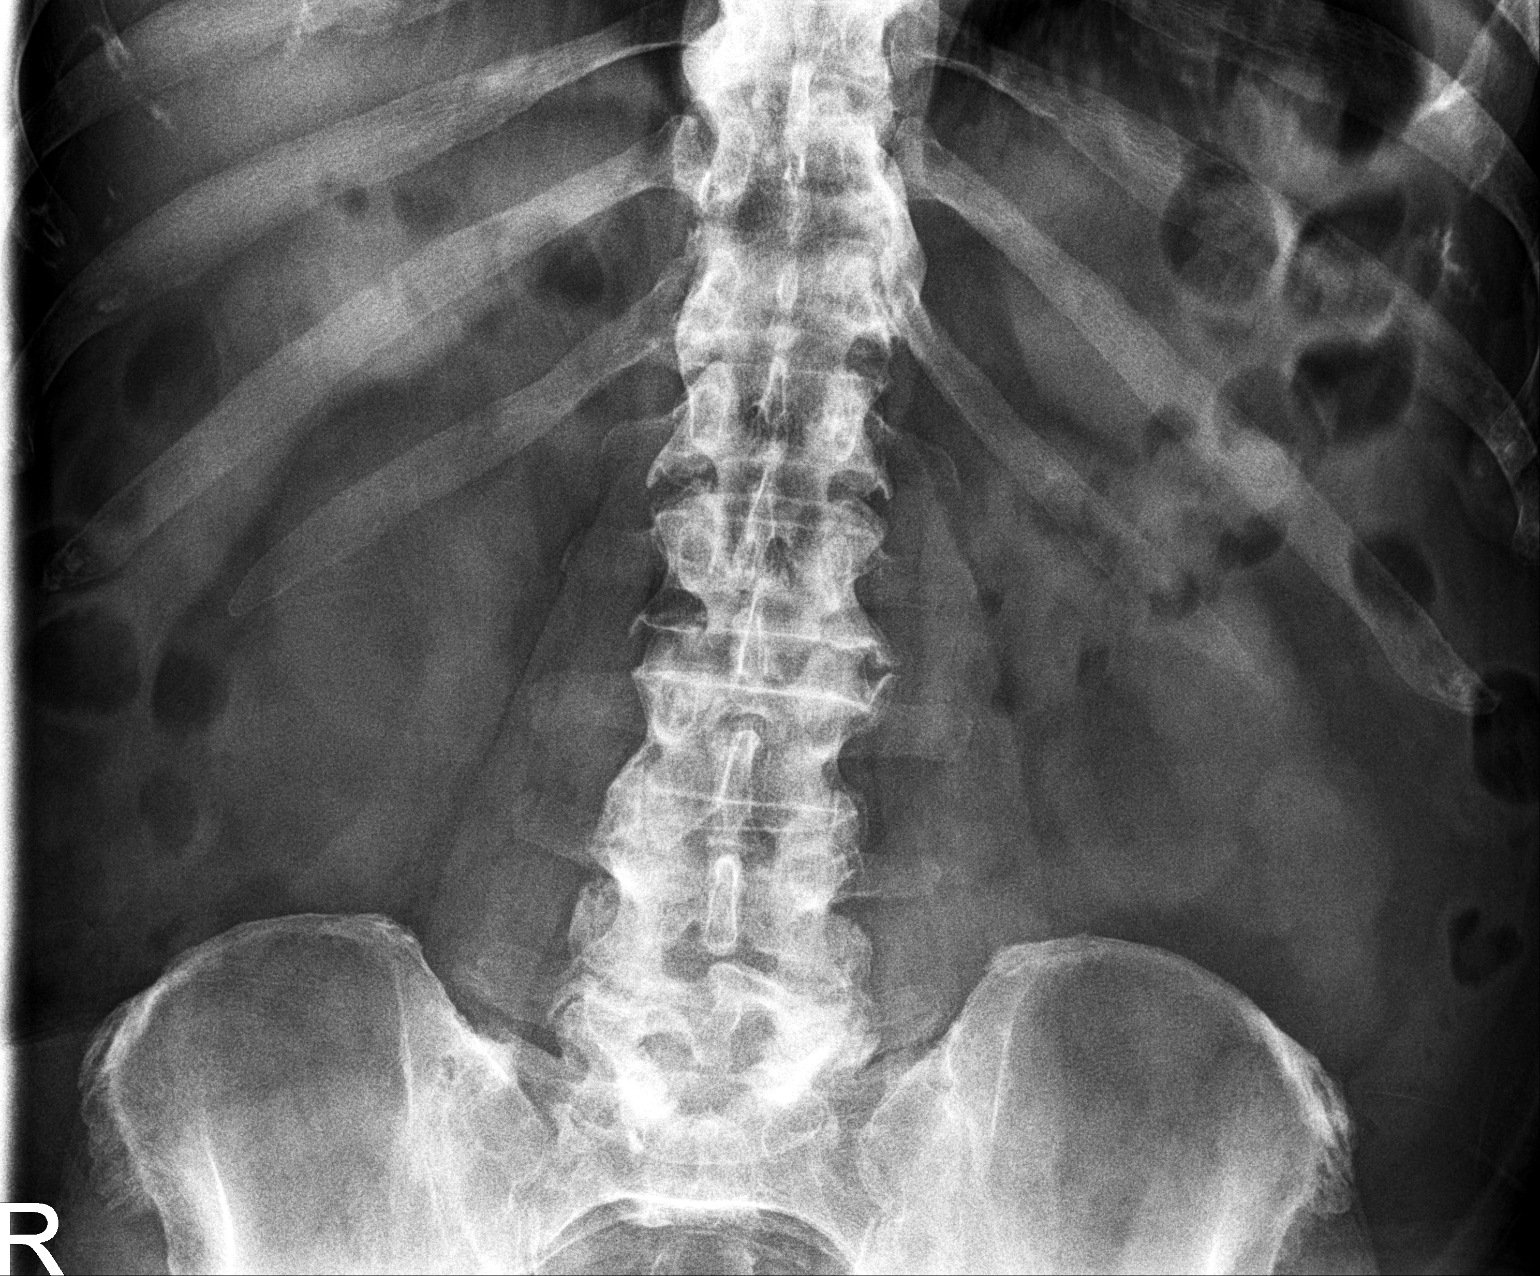

[abdomen kub (2 of 2)]
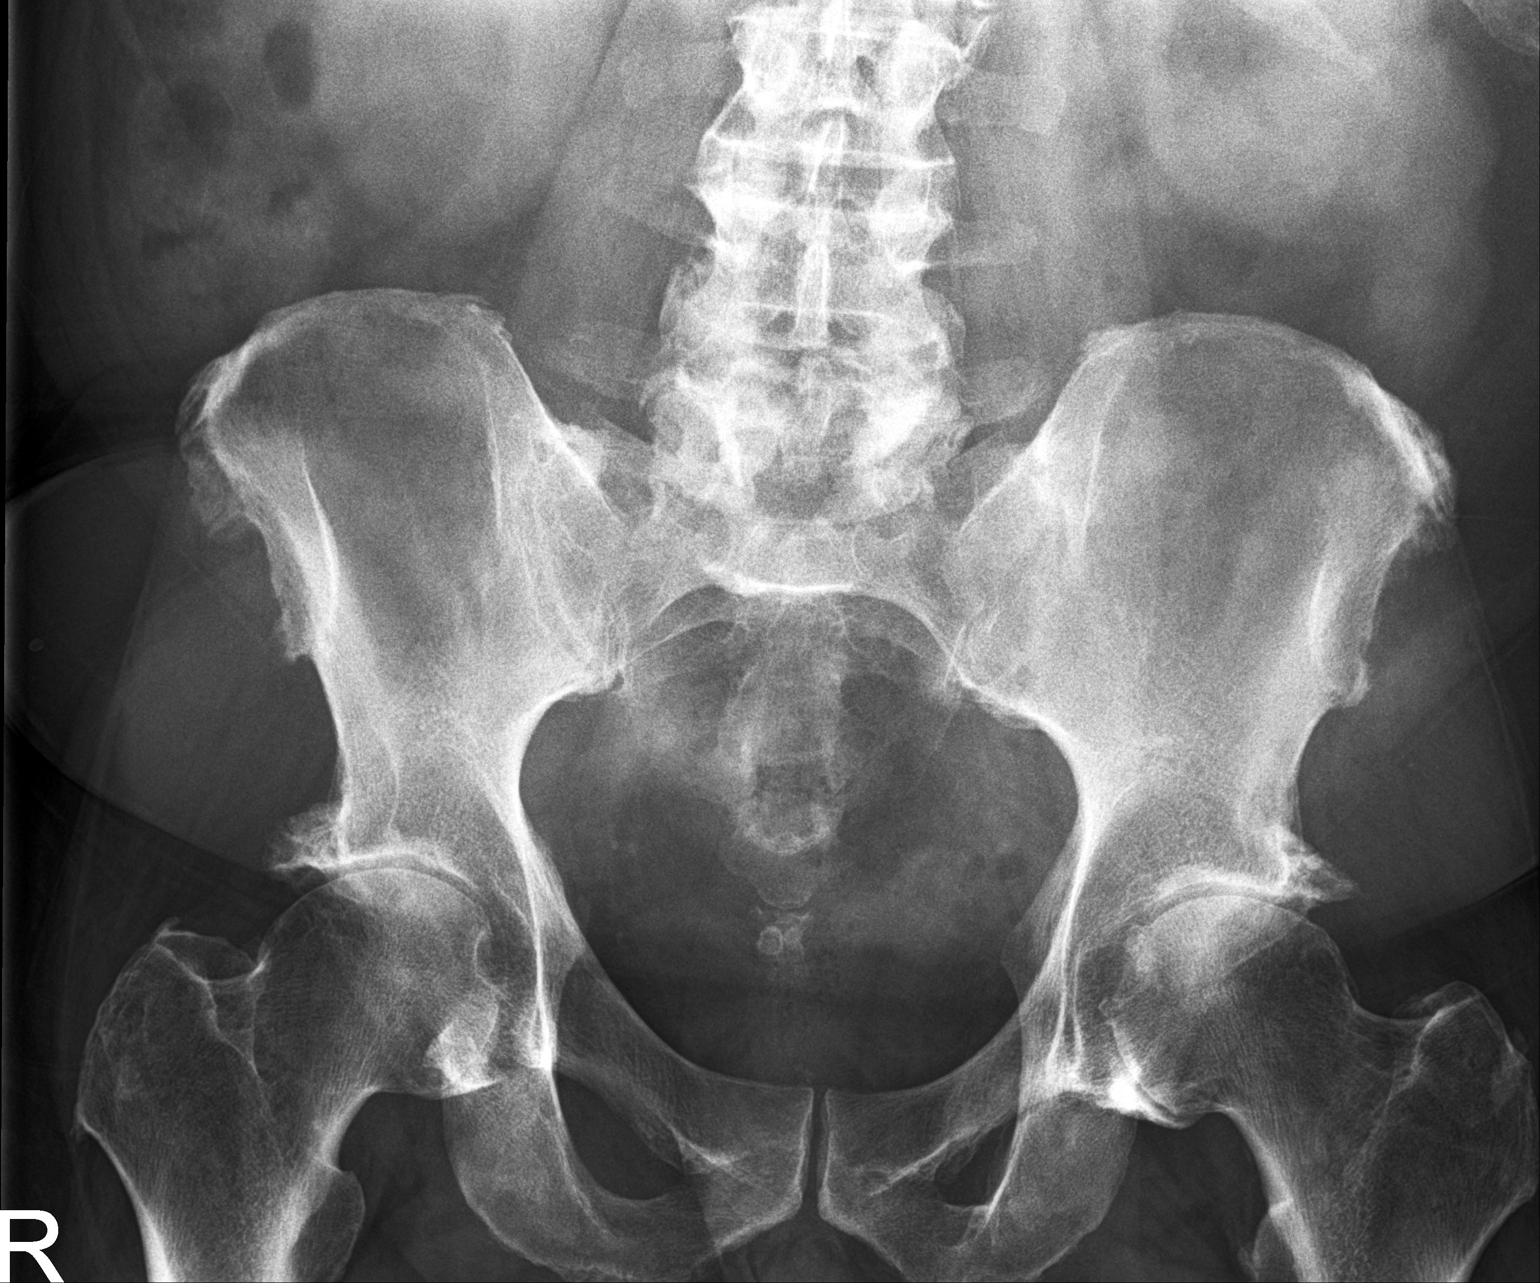

[2 of 2 positions shown; findings below may reference images not displayed]

FINDINGS: Air is seen within nondistended loops of small and large bowel. No
portal venous gas or pneumatosis is seen. No gross free air. The
superior aspect of the liver is not imaged. Moderate to severe
bilateral femoroacetabular osteoarthritis.
IMPRESSION: Nonobstructed bowel-gas pattern.

## 2021-08-25 IMAGING — CT CT ABD-PELV W/ CM
2 of 5 series · 14 of 46 positions shown, 16 images · IV contrast (Omnipaque or Isovue)
Comparison: KUB [DATE]

CLINICAL DATA: Acute abdominal pain. Nonlocalized possible
diverticulitis. Not able to sleep.

EXAM:
CT ABDOMEN AND PELVIS WITH CONTRAST
TECHNIQUE: Multidetector CT imaging of the abdomen and pelvis was performed
using the standard protocol following bolus administration of
intravenous contrast.

[Series 2: axial st · axial · 0.83mm/px · z∈[-429,+16]mm · 11 of 103 slices shown, 13 images]
[im 7/103  soft-tissue]
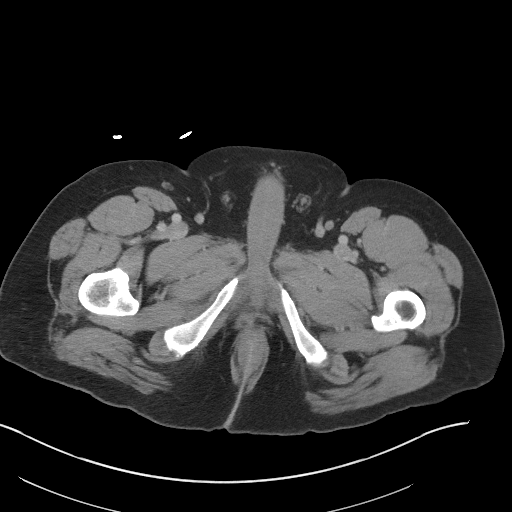
[im 7/103  bone]
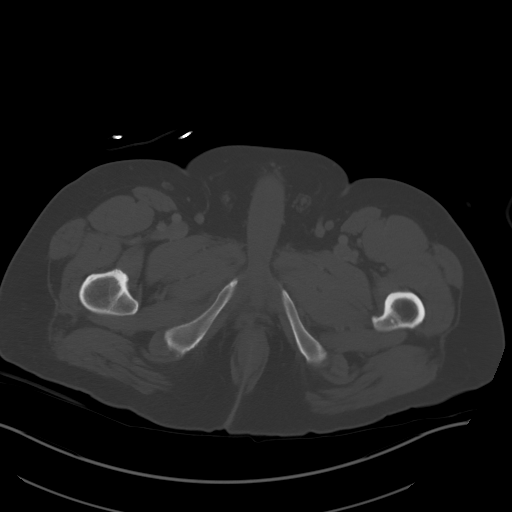
[im 20/103  soft-tissue]
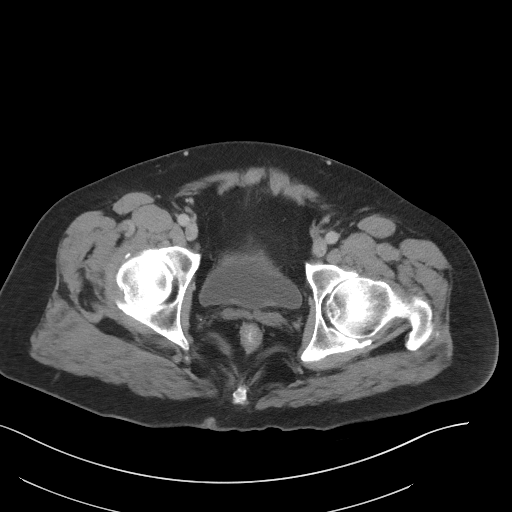
[im 26/103  soft-tissue]
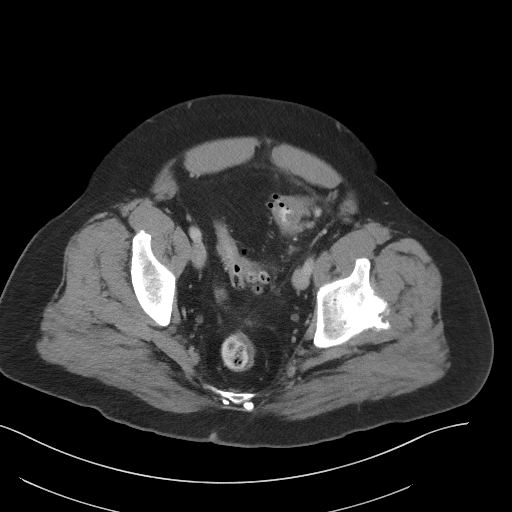
[im 32/103  soft-tissue]
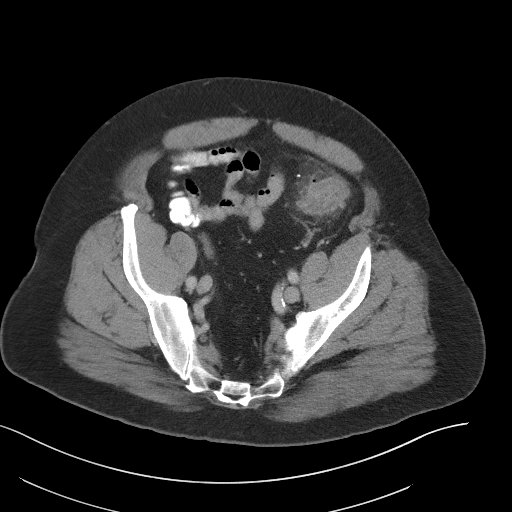
[im 45/103  soft-tissue]
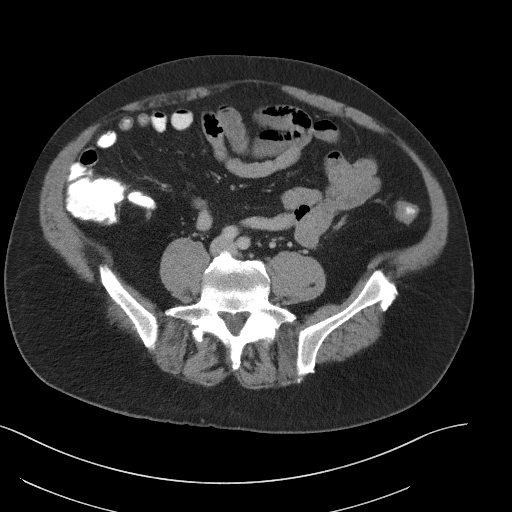
[im 52/103  soft-tissue]
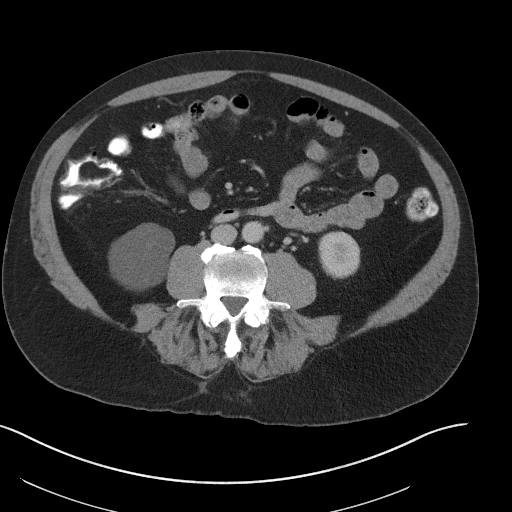
[im 58/103  soft-tissue]
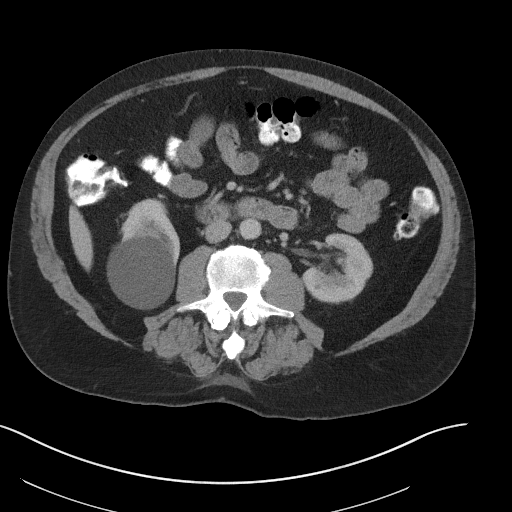
[im 71/103  soft-tissue]
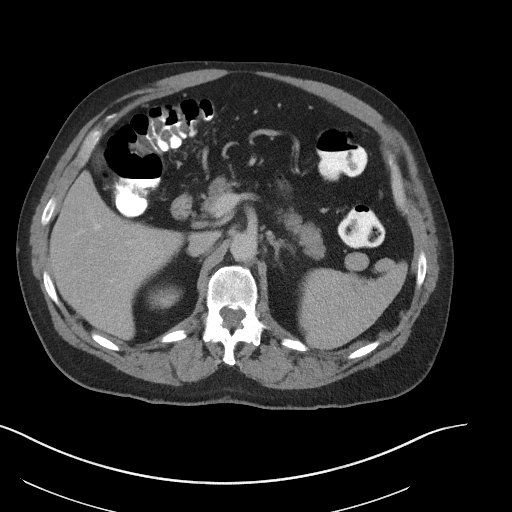
[im 77/103  soft-tissue]
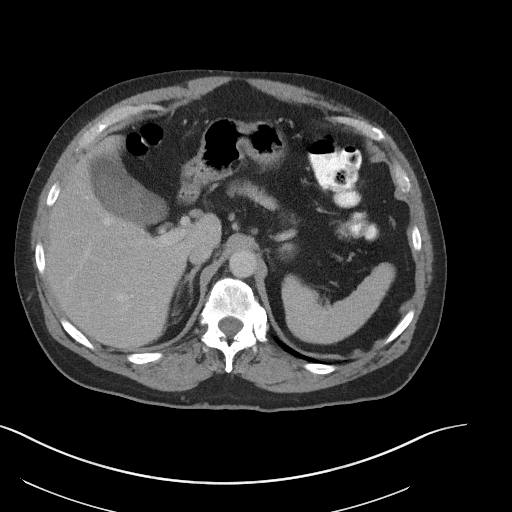
[im 77/103  bone]
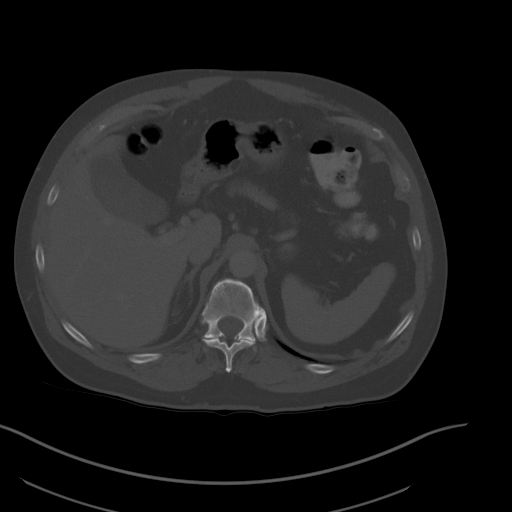
[im 83/103  soft-tissue]
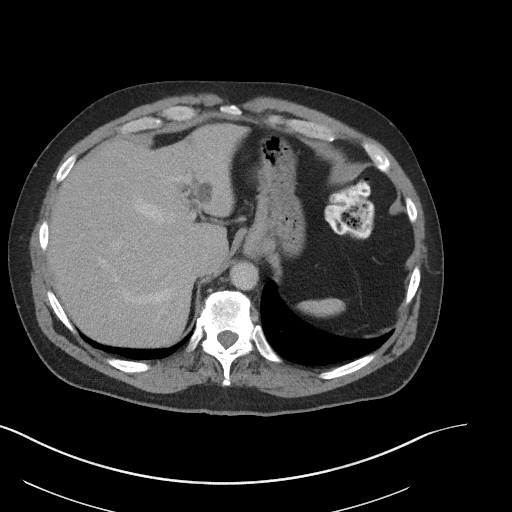
[im 96/103  soft-tissue]
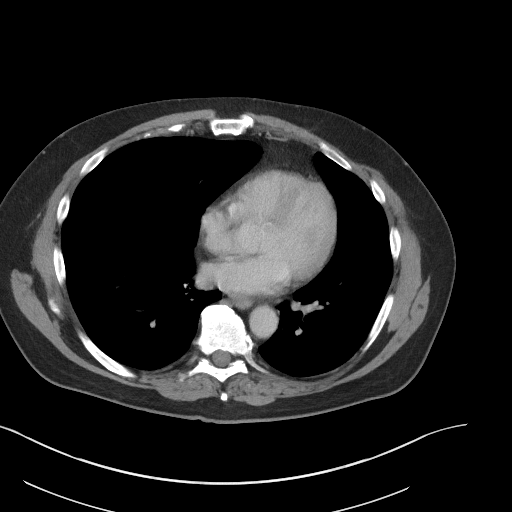

[Series 4: coronal st · coronal · 0.90mm/px · 3 of 117 slices shown]
[im 39/117  soft-tissue]
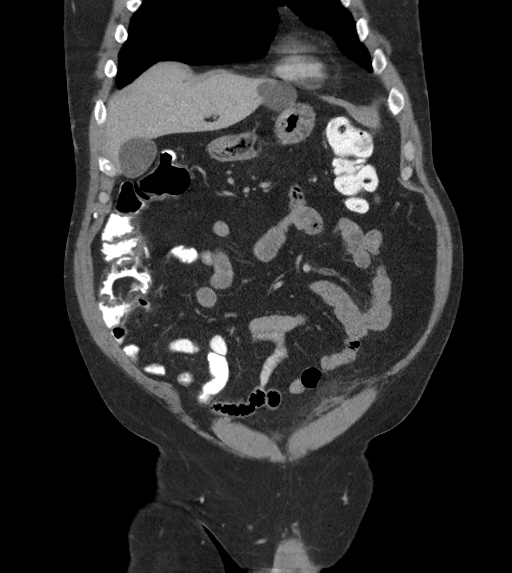
[im 52/117  soft-tissue]
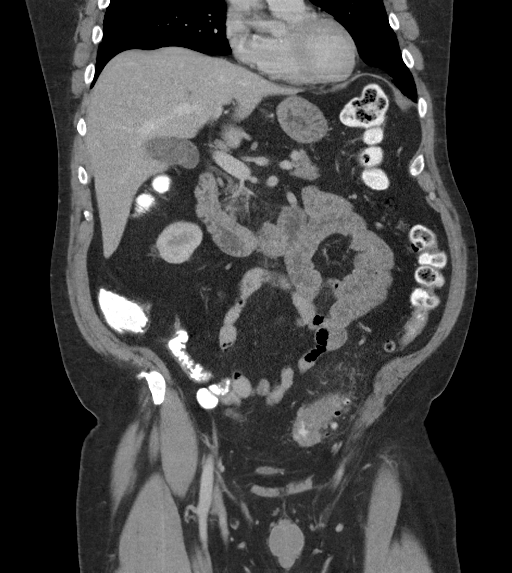
[im 65/117  soft-tissue]
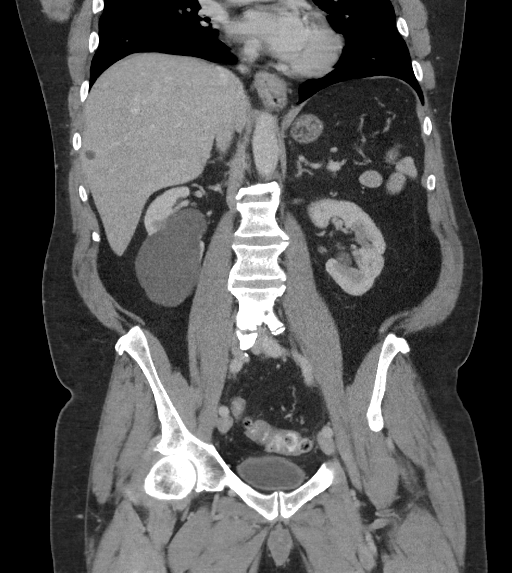

[14 of 46 positions shown; findings below may reference images not displayed]

RADIATION DOSE REDUCTION: This exam was performed according to the
departmental dose-optimization program which includes automated
exposure control, adjustment of the mA and/or kV according to
patient size and/or use of iterative reconstruction technique.

CONTRAST:  80mL OMNIPAQUE IOHEXOL 350 MG/ML SOLN
FINDINGS: Lower chest: Minimal curvilinear subsegmental atelectasis versus
scarring within the lingula. Heart size is normal. No pericardial
effusion. Tiny sliding hiatal hernia.

Hepatobiliary: There are multiple low-density lesions seen within
the right and left hepatic lobes. Those that are large enough to
characterize demonstrate fluid density suggesting cysts. The largest
is multilobular either a single loculated cyst versus 2 adjacent
cysts measuring up to 3.3 x 4.1 x 2.8 cm (transverse by AP by
craniocaudal, axial series 2, image 18). Other smaller low-density
liver lesions remain too small to further characterize but are
compatible with cysts. The gallbladder is unremarkable. No
intrahepatic or extrahepatic biliary ductal dilatation.

Pancreas: No mass or inflammatory fat stranding. No pancreatic
ductal dilatation is seen.

Spleen: Normal in size without focal abnormality. Incidental note of
a splenule within the splenic hilum.

Adrenals/Urinary Tract: Adrenal glands are unremarkable. The kidneys
enhance uniformly and are symmetric in size without hydronephrosis.
No renal stone is seen. r there is a multilobular partially
exophytic cyst within the mid and lower pole of the right kidney
measuring up to 5.6 x 6.2 x 9.3 cm (transverse by AP by
craniocaudal). The urinary bladder is decompressed limiting
evaluation.

Stomach/Bowel: Oral contrast is seen as distal as the rectum.
Moderate to high-grade sigmoid and mild descending colon
diverticulosis. There is moderate wall thickening and inflammatory
fat stranding predominantly within the medial and anterior aspect of
the junction of the distal descending colon and proximal sigmoid
There is scattered air within multiple diverticula within this
region of inflammation. There is a focal region of air measuring up
to approximately 3 x 2 x 8 mm (transverse by AP by craniocaudal
axial image 71 and coronal image 43) which may be within an inflamed
diverticulum,1 however a tiny contained perforation is felt less
likely but cannot be completely excluded. There is also a tiny 3 mm
calcific density within the region of the inflamed fat anterior
superior to the proximal sigmoid colon in this region (axial image
69, coronal image 44). The terminal ileum is unremarkable. The
appendix appears within normal limits. No dilated loops of bowel to
indicate bowel obstruction.

Vascular/Lymphatic: No abdominal aortic aneurysm. No mesenteric,
retroperitoneal, or pelvic lymphadenopathy.

Reproductive: The prostate and seminal vesicles are grossly
unremarkable.

Other: No abdominal wall hernia or abnormality.No abdominopelvic
ascites. No pneumoperitoneum.

Musculoskeletal: Mild-to-moderate multilevel degenerative bridging
osteophytes are seen throughout the visualized thoracolumbar spine.
No significant disc space narrowing. This is compatible with diffuse
idiopathic skeletal hyperostosis (dish). Moderate to severe
bilateral sacroiliac joint osteoarthritis. Moderate bilateral
femoroacetabular osteoarthritis.
IMPRESSION: :
IMPRESSION: 1. Moderate acute diverticulitis of the proximal sigmoid colon
extending into the junction with the descending colon. There are
scattered inflamed diverticula. There is a tiny focus of air
extending into the proximal anterior sigmoid colon wall (axial image
71) which may represent air within a diverticulum versus a tiny
contained perforation. No pneumoperitoneum is seen elsewhere.
2. No free fluid or walled-off abscess.
3. Small sliding hiatal hernia.
4. Normal appendix.

## 2021-08-25 MED ORDER — IOHEXOL 9 MG/ML PO SOLN
ORAL | Status: AC
  Filled 2021-08-25: qty 1000

## 2021-08-25 MED ORDER — IOHEXOL 350 MG/ML SOLN
80.0000 mL | Freq: Once | INTRAVENOUS | Status: AC | PRN
Start: 2021-08-25 — End: 2021-08-25
  Administered 2021-08-25: 80 mL via INTRAVENOUS

## 2021-08-25 NOTE — Progress Notes (Signed)
? ?  Subjective:  ? ? Patient ID: Kyle Arellano, male    DOB: 1955/11/08, 66 y.o.   MRN: [id number] ? ? ?Chief Complaint: Abdominal Pain (Unable to have a BM, Not sleeping) ? ? ?HPI ?Patient comes in c/o diarrhea last week then became constipated the last several days. He is now having abdominal. Rates pain 6-7/10. Nothing makes pain worse. Laying still makes it  better. He has not taken anything other then tylenol fro his abdominal pain. ? ? ? ? ?Review of Systems  ?Constitutional:  Positive for appetite change (decreased) and fever (?). Negative for chills.  ?Gastrointestinal:  Positive for abdominal pain, constipation and diarrhea. Negative for nausea and vomiting.  ?Neurological: Negative.   ? ?   ?Objective:  ? Physical Exam ?Vitals reviewed.  ?Constitutional:   ?   Appearance: He is well-developed.  ?Cardiovascular:  ?   Rate and Rhythm: Normal rate and regular rhythm.  ?Pulmonary:  ?   Effort: Pulmonary effort is normal.  ?Skin: ?   General: Skin is warm.  ?Neurological:  ?   General: No focal deficit present.  ?   Mental Status: He is alert.  ?Psychiatric:     ?   Mood and Affect: Mood normal.     ?   Behavior: Behavior normal.  ? ? ?BP 128/85   Pulse 93   Temp 98.9 ?F (37.2 ?C) (Temporal)   Resp 20   Ht '5\' 7"'$  (1.702 m)   Wt 208 lb (94.3 kg)   SpO2 96%   BMI 32.58 kg/m? KUB-  ? ? ?KUB - negative-Preliminary reading by Ronnald Collum, FNP  [facility] ? ?   ?Assessment & Plan:  ?Kyle Arellano in today with chief complaint of Abdominal Pain (Unable to have a BM, Not sleeping) ? ? ?1. Generalized abdominal pain ?NPO until Ct is done ?- DG Abd 1 View ?- CT Abdomen Pelvis W Contrast; Future ? ? ? ?The above assessment and management plan was discussed with the patient. The patient verbalized understanding of and has agreed to the management plan. Patient is aware to call the clinic if symptoms persist or worsen. Patient is aware when to return to the clinic for a follow-up visit. Patient educated on when it is  appropriate to go to the emergency department.  ? ?Mary-Margaret Hassell Done, FNP ? ? ? ?

## 2021-08-25 NOTE — Telephone Encounter (Signed)
Patient was contacted and notified of diverticulitis with possible perforation.  Was notified that the likely course of action was deferred to the emergency department and be fully evaluated for possible perforation but he said he did not have any way to get there tonight, as instructed that he could call EMS.  If he worsens at all call EMS, if not he says he will go in the morning.  It was heavily recommended that he consider going tonight. ?

## 2021-08-26 DIAGNOSIS — K631 Perforation of intestine (nontraumatic): Secondary | ICD-10-CM | POA: Diagnosis not present

## 2021-08-26 DIAGNOSIS — R1032 Left lower quadrant pain: Secondary | ICD-10-CM | POA: Diagnosis not present

## 2021-08-26 DIAGNOSIS — K5732 Diverticulitis of large intestine without perforation or abscess without bleeding: Secondary | ICD-10-CM | POA: Diagnosis not present

## 2021-08-26 DIAGNOSIS — E785 Hyperlipidemia, unspecified: Secondary | ICD-10-CM | POA: Diagnosis not present

## 2021-08-26 DIAGNOSIS — E7800 Pure hypercholesterolemia, unspecified: Secondary | ICD-10-CM | POA: Diagnosis not present

## 2021-08-26 DIAGNOSIS — K449 Diaphragmatic hernia without obstruction or gangrene: Secondary | ICD-10-CM | POA: Diagnosis not present

## 2021-08-26 DIAGNOSIS — K5720 Diverticulitis of large intestine with perforation and abscess without bleeding: Secondary | ICD-10-CM | POA: Diagnosis not present

## 2021-08-26 DIAGNOSIS — U071 COVID-19: Secondary | ICD-10-CM | POA: Diagnosis not present

## 2021-08-26 DIAGNOSIS — F32A Depression, unspecified: Secondary | ICD-10-CM | POA: Diagnosis not present

## 2021-08-26 DIAGNOSIS — Z79899 Other long term (current) drug therapy: Secondary | ICD-10-CM | POA: Diagnosis not present

## 2021-08-26 DIAGNOSIS — R197 Diarrhea, unspecified: Secondary | ICD-10-CM | POA: Diagnosis not present

## 2021-08-26 NOTE — Telephone Encounter (Signed)
Spoke with pts mom. He is currently in the ER. ?

## 2021-09-01 ENCOUNTER — Telehealth: Payer: Self-pay | Admitting: Family Medicine

## 2021-09-01 NOTE — Telephone Encounter (Signed)
Patient wanted hospital f/u with [facility].   Appointment made for 04/05 hospital f/u ?

## 2021-09-02 NOTE — Telephone Encounter (Signed)
Unable to change to TCM as patient only wants to see Dr Livia Snellen and no appts are available within the 14 day TCM window ?

## 2021-09-20 ENCOUNTER — Emergency Department (HOSPITAL_BASED_OUTPATIENT_CLINIC_OR_DEPARTMENT_OTHER): Payer: Medicare Other

## 2021-09-20 ENCOUNTER — Emergency Department (HOSPITAL_COMMUNITY): Payer: Medicare Other

## 2021-09-20 ENCOUNTER — Encounter (HOSPITAL_BASED_OUTPATIENT_CLINIC_OR_DEPARTMENT_OTHER): Payer: Self-pay | Admitting: *Deleted

## 2021-09-20 ENCOUNTER — Inpatient Hospital Stay (HOSPITAL_BASED_OUTPATIENT_CLINIC_OR_DEPARTMENT_OTHER)
Admission: EM | Admit: 2021-09-20 | Discharge: 2021-09-22 | DRG: 093 | Disposition: A | Discharge status: Home / Self Care | Payer: Medicare Other | Attending: Internal Medicine | Admitting: Internal Medicine

## 2021-09-20 ENCOUNTER — Other Ambulatory Visit: Payer: Self-pay

## 2021-09-20 DIAGNOSIS — E7800 Pure hypercholesterolemia, unspecified: Secondary | ICD-10-CM | POA: Diagnosis present

## 2021-09-20 DIAGNOSIS — G459 Transient cerebral ischemic attack, unspecified: Secondary | ICD-10-CM | POA: Diagnosis not present

## 2021-09-20 DIAGNOSIS — Z8249 Family history of ischemic heart disease and other diseases of the circulatory system: Secondary | ICD-10-CM | POA: Diagnosis not present

## 2021-09-20 DIAGNOSIS — Z79899 Other long term (current) drug therapy: Secondary | ICD-10-CM

## 2021-09-20 DIAGNOSIS — I639 Cerebral infarction, unspecified: Principal | ICD-10-CM

## 2021-09-20 DIAGNOSIS — R4701 Aphasia: Secondary | ICD-10-CM | POA: Diagnosis not present

## 2021-09-20 DIAGNOSIS — R479 Unspecified speech disturbances: Secondary | ICD-10-CM

## 2021-09-20 DIAGNOSIS — Z87891 Personal history of nicotine dependence: Secondary | ICD-10-CM | POA: Diagnosis not present

## 2021-09-20 DIAGNOSIS — I6781 Acute cerebrovascular insufficiency: Secondary | ICD-10-CM | POA: Diagnosis not present

## 2021-09-20 DIAGNOSIS — R9431 Abnormal electrocardiogram [ECG] [EKG]: Secondary | ICD-10-CM | POA: Diagnosis not present

## 2021-09-20 DIAGNOSIS — Z8616 Personal history of COVID-19: Secondary | ICD-10-CM | POA: Diagnosis not present

## 2021-09-20 DIAGNOSIS — F209 Schizophrenia, unspecified: Secondary | ICD-10-CM | POA: Diagnosis present

## 2021-09-20 DIAGNOSIS — Z833 Family history of diabetes mellitus: Secondary | ICD-10-CM | POA: Diagnosis not present

## 2021-09-20 DIAGNOSIS — R29818 Other symptoms and signs involving the nervous system: Secondary | ICD-10-CM | POA: Diagnosis not present

## 2021-09-20 DIAGNOSIS — E785 Hyperlipidemia, unspecified: Secondary | ICD-10-CM

## 2021-09-20 DIAGNOSIS — F32A Depression, unspecified: Secondary | ICD-10-CM | POA: Diagnosis present

## 2021-09-20 LAB — PROTIME-INR
INR: 1 (ref 0.8–1.2)
Prothrombin Time: 12.8 seconds (ref 11.4–15.2)

## 2021-09-20 LAB — CBC
HCT: 44.4 % (ref 39.0–52.0)
Hemoglobin: 14.9 g/dL (ref 13.0–17.0)
MCH: 29.7 pg (ref 26.0–34.0)
MCHC: 33.6 g/dL (ref 30.0–36.0)
MCV: 88.4 fL (ref 80.0–100.0)
Platelets: 228 10*3/uL (ref 150–400)
RBC: 5.02 MIL/uL (ref 4.22–5.81)
RDW: 14.1 % (ref 11.5–15.5)
WBC: 8 10*3/uL (ref 4.0–10.5)
nRBC: 0 % (ref 0.0–0.2)

## 2021-09-20 LAB — DIFFERENTIAL
Abs Immature Granulocytes: 0.03 10*3/uL (ref 0.00–0.07)
Basophils Absolute: 0.1 10*3/uL (ref 0.0–0.1)
Basophils Relative: 1 %
Eosinophils Absolute: 0.1 10*3/uL (ref 0.0–0.5)
Eosinophils Relative: 1 %
Immature Granulocytes: 0 %
Lymphocytes Relative: 12 %
Lymphs Abs: 0.9 10*3/uL (ref 0.7–4.0)
Monocytes Absolute: 0.6 10*3/uL (ref 0.1–1.0)
Monocytes Relative: 8 %
Neutro Abs: 6.2 10*3/uL (ref 1.7–7.7)
Neutrophils Relative %: 78 %

## 2021-09-20 LAB — COMPREHENSIVE METABOLIC PANEL
ALT: 30 U/L (ref 0–44)
AST: 26 U/L (ref 15–41)
Albumin: 4.2 g/dL (ref 3.5–5.0)
Alkaline Phosphatase: 58 U/L (ref 38–126)
Anion gap: 10 (ref 5–15)
BUN: 20 mg/dL (ref 8–23)
CO2: 25 mmol/L (ref 22–32)
Calcium: 9.3 mg/dL (ref 8.9–10.3)
Chloride: 103 mmol/L (ref 98–111)
Creatinine, Ser: 1.31 mg/dL — ABNORMAL HIGH (ref 0.61–1.24)
GFR, Estimated: >60 mL/min (ref >60)
Glucose, Bld: 181 mg/dL — ABNORMAL HIGH (ref 70–99)
Potassium: 4 mmol/L (ref 3.5–5.1)
Sodium: 138 mmol/L (ref 135–145)
Total Bilirubin: 0.4 mg/dL (ref 0.3–1.2)
Total Protein: 6.8 g/dL (ref 6.5–8.1)

## 2021-09-20 LAB — CBG MONITORING, ED: Glucose-Capillary: 177 mg/dL — ABNORMAL HIGH (ref 70–99)

## 2021-09-20 LAB — APTT: aPTT: 31 seconds (ref 24–36)

## 2021-09-20 IMAGING — MR MR HEAD W/O CM
10 of 11 series · 43 of 48 positions shown · non-contrast
Comparison: Prior CT and CTA from earlier the same day.

CLINICAL DATA: Initial evaluation for acute neuro deficit, stroke.

EXAM:
MRI HEAD WITHOUT CONTRAST
TECHNIQUE: Multiplanar, multiecho pulse sequences of the brain and surrounding
structures were obtained without intravenous contrast.

[Series 5: DWI · axial · 3.0mm · 0.88mm/px · z∈[-84,+63]mm · 10 of 100 slices shown (1 of 4)]
[im 1/100]
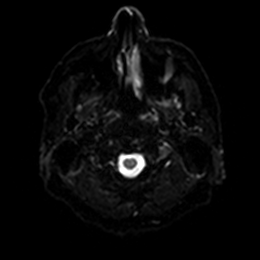
[im 12/100]
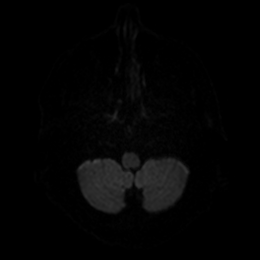
[im 23/100]
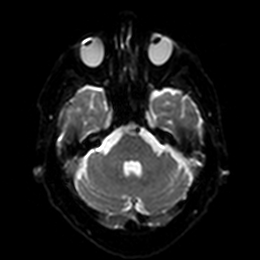
[im 34/100]
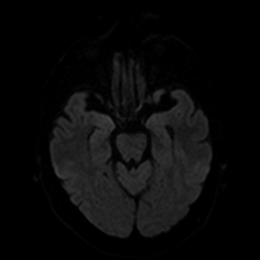
[im 45/100]
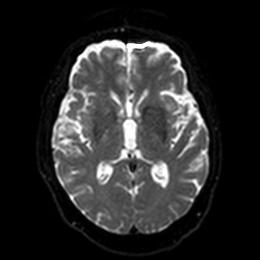
[im 56/100]
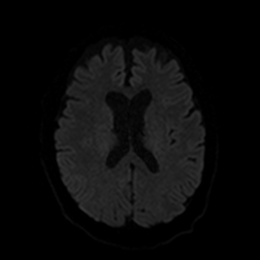
[im 67/100]
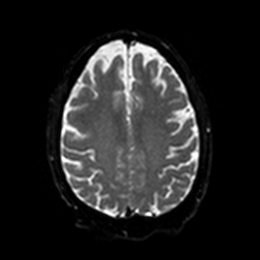
[im 78/100]
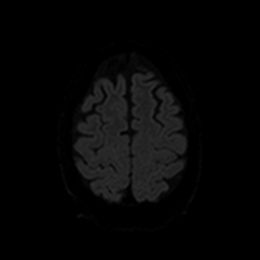
[im 89/100]
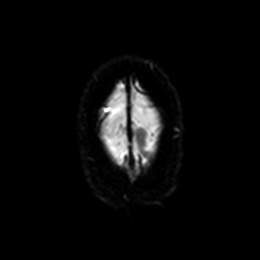
[im 100/100]
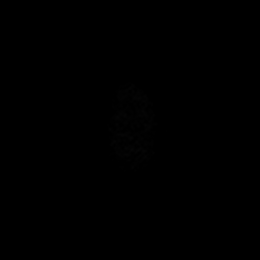

[Series 6: DWI · axial · 3.0mm · 0.88mm/px · z∈[-84,+63]mm · 5 of 49 slices shown (2 of 4)]
[im 1/49]
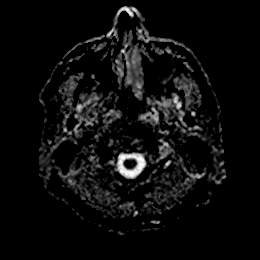
[im 13/49]
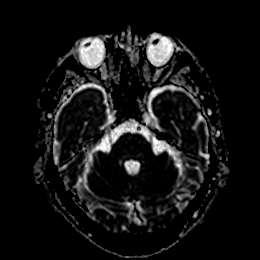
[im 25/49]
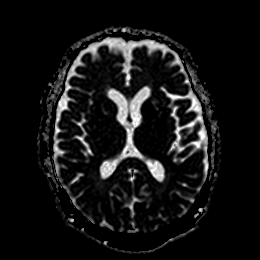
[im 37/49]
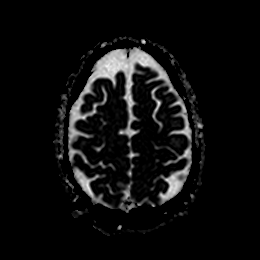
[im 49/49]
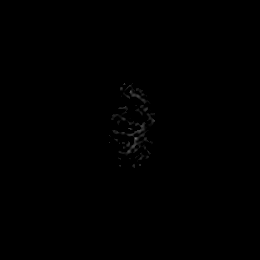

[Series 7: DWI · coronal · 4.0mm · 0.88mm/px · 6 of 64 slices shown (3 of 4)]
[im 1/64]
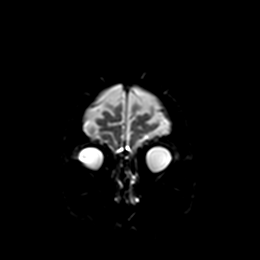
[im 13/64]
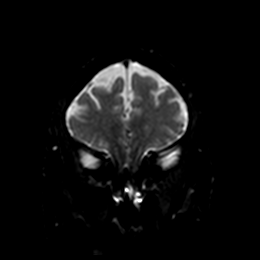
[im 26/64]
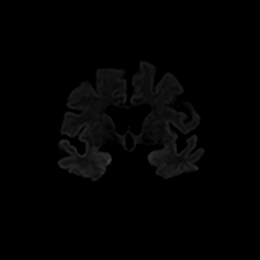
[im 38/64]
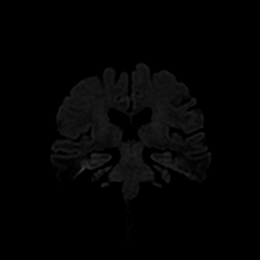
[im 51/64]
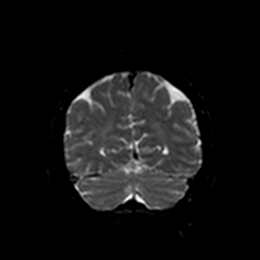
[im 64/64]
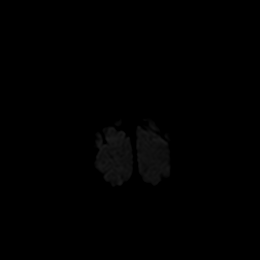

[Series 8: DWI · coronal · 4.0mm · 0.88mm/px · 3 of 32 slices shown (4 of 4)]
[im 1/32]
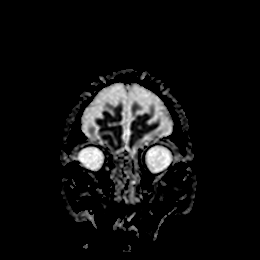
[im 16/32]
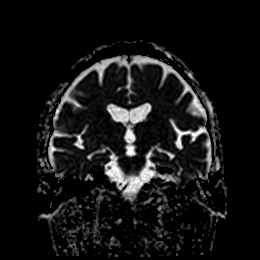
[im 32/32]
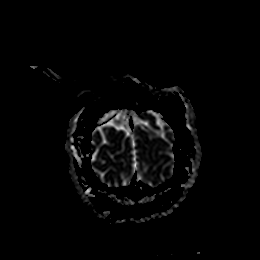

[Series 9: T1 · sagittal · 5.0mm · 0.75mm/px · 2 of 23 slices shown]
[im 1/23]
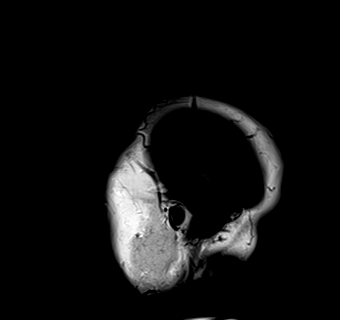
[im 23/23]
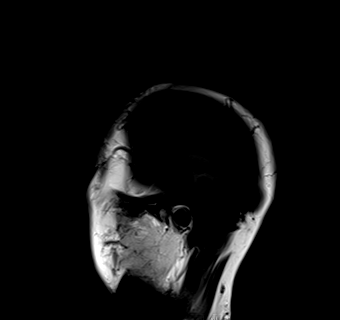

[Series 10: T2 · axial · 5.0mm · 0.72mm/px · z∈[-83,+61]mm · 2 of 25 slices shown (1 of 2)]
[im 1/25]
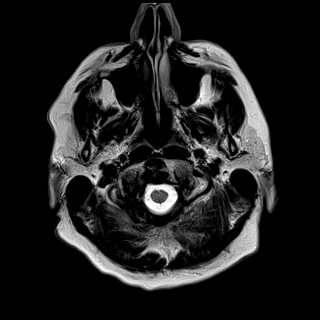
[im 25/25]
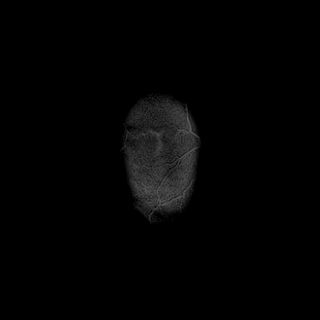

[Series 11: FLAIR · axial · 5.0mm · 0.45mm/px · z∈[-84,+59]mm · 2 of 25 slices shown]
[im 1/25]
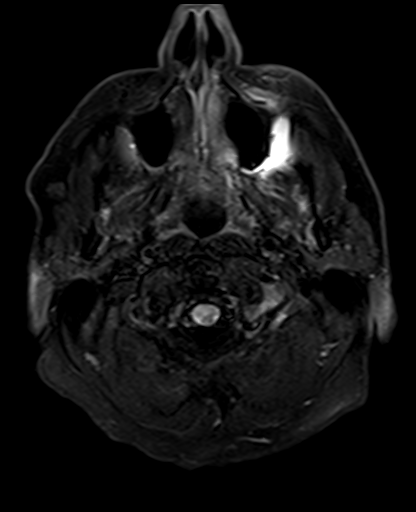
[im 25/25]
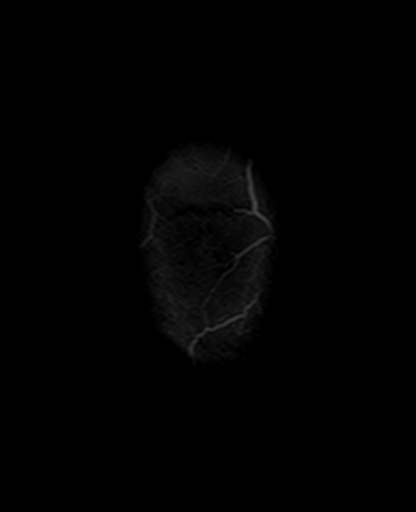

[Series 13: pha_images · axial · 3.0mm · 0.90mm/px · z∈[-101,+76]mm · 5 of 60 slices shown]
[im 1/60]
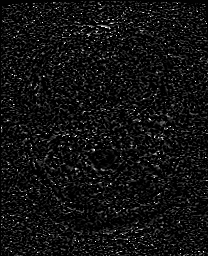
[im 15/60]
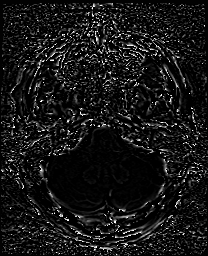
[im 30/60]
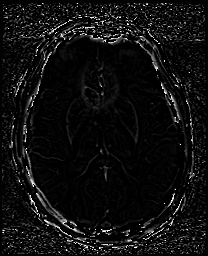
[im 45/60]
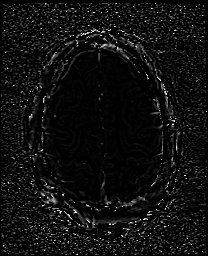
[im 60/60]
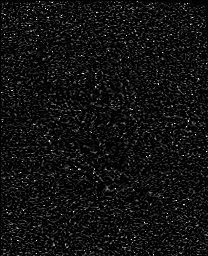

[Series 14: swi_images · axial · 3.0mm · 0.90mm/px · z∈[-101,+76]mm · 5 of 60 slices shown]
[im 1/60]
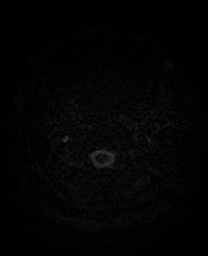
[im 15/60]
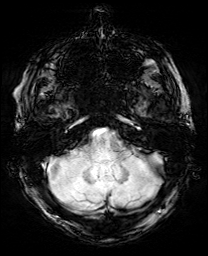
[im 30/60]
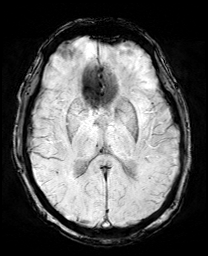
[im 45/60]
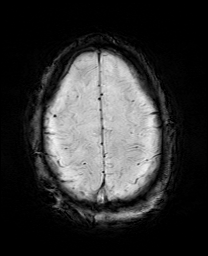
[im 60/60]
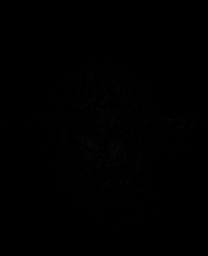

[Series 17: T2 · coronal · 5.0mm · 0.34mm/px · 3 of 29 slices shown (2 of 2)]
[im 1/29]
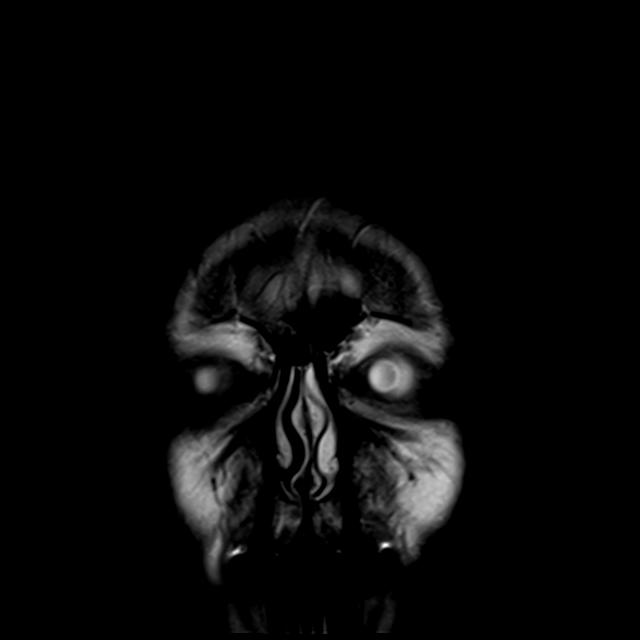
[im 15/29]
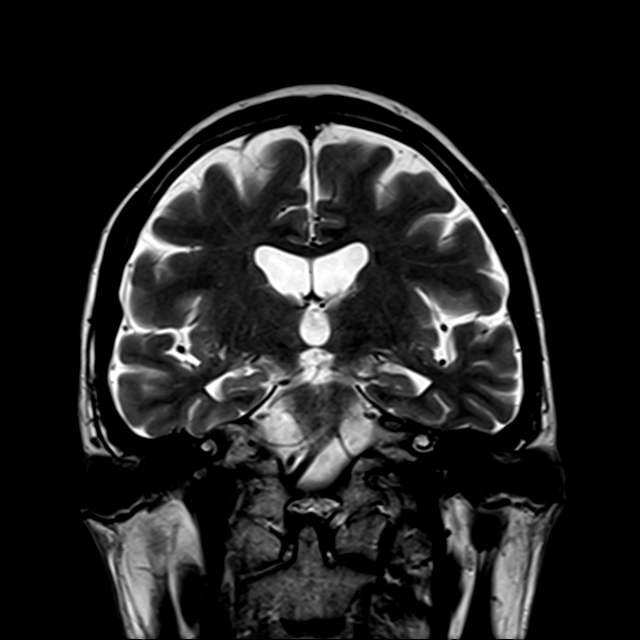
[im 29/29]
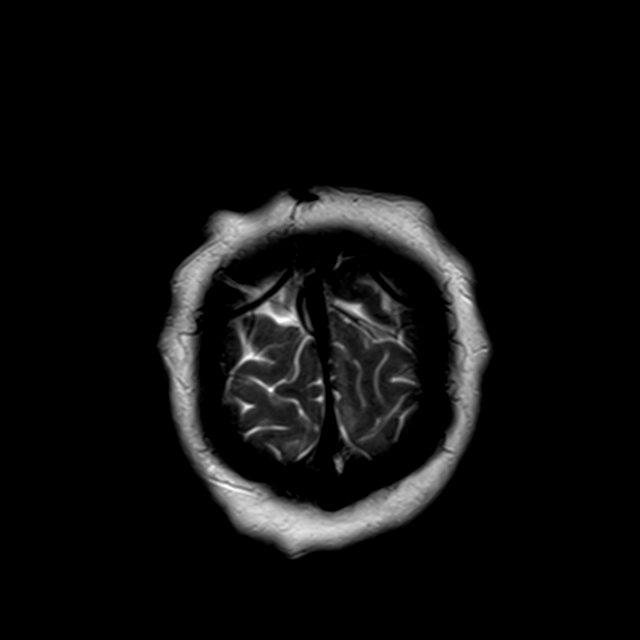

[43 of 48 positions shown; findings below may reference images not displayed]

FINDINGS: Brain: Cerebral volume within normal limits. Small focus of FLAIR
hyperintensity noted within the left subinsular white matter, likely
chronic microvascular ischemic disease. No other significant
cerebral white matter disease for age. No evidence for acute or
subacute infarct. Gray-white matter differentiation maintained. No
areas of chronic cortical infarction. No acute or chronic
intracranial blood products.

No mass lesion or midline shift. No hydrocephalus or extra-axial
fluid collection. Pituitary gland suprasellar region normal. Midline
structures intact.

Vascular: Major intracranial vascular flow voids are well
maintained.

Skull and upper cervical spine: Craniocervical junction normal. Bone
marrow signal intensity normal. No scalp soft tissue abnormality.

Sinuses/Orbits: Right gaze noted. Globes and orbital soft tissues
otherwise unremarkable. Mild mucosal thickening about the sphenoid
sinuses. No mastoid effusion.

Other: None.
IMPRESSION: Normal brain MRI for age.  No acute intracranial abnormality.

## 2021-09-20 IMAGING — CT CT HEAD W/O CM
4 series · 15 of 47 positions shown, 17 images · non-contrast
Comparison: None.

CLINICAL DATA: Transient ischemic attack. Speaking difficulty since
10 a.m. this morning.



[Series 2: head wo · axial · 0.44mm/px · z∈[-108,+7]mm · 7 of 31 slices shown, 9 images]
[im 4/31  brain]
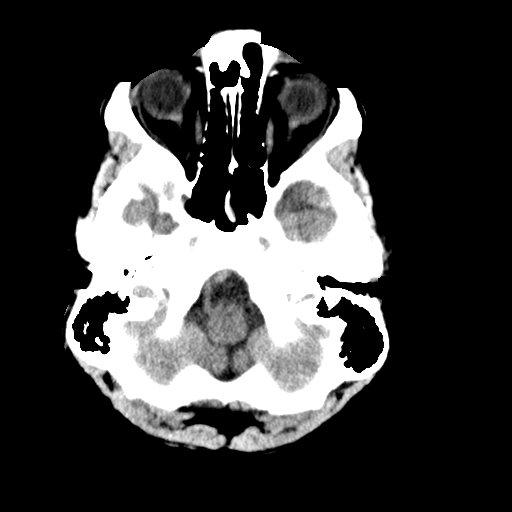
[im 4/31  bone]
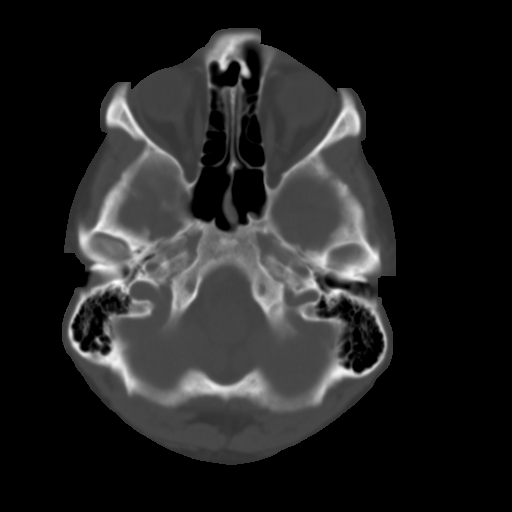
[im 8/31  brain]
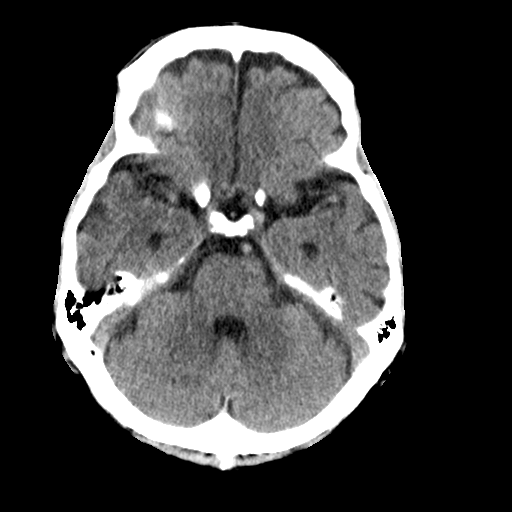
[im 12/31  brain]
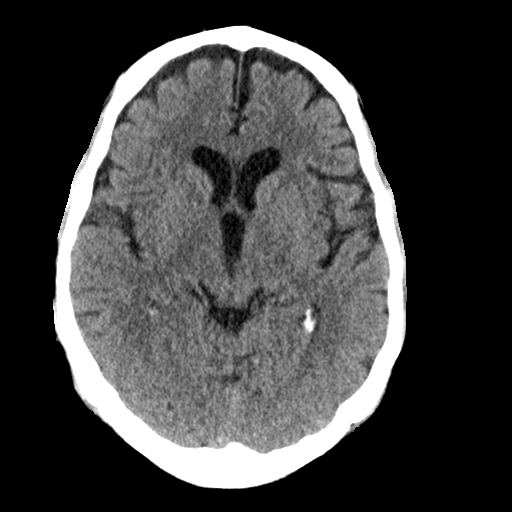
[im 16/31  brain]
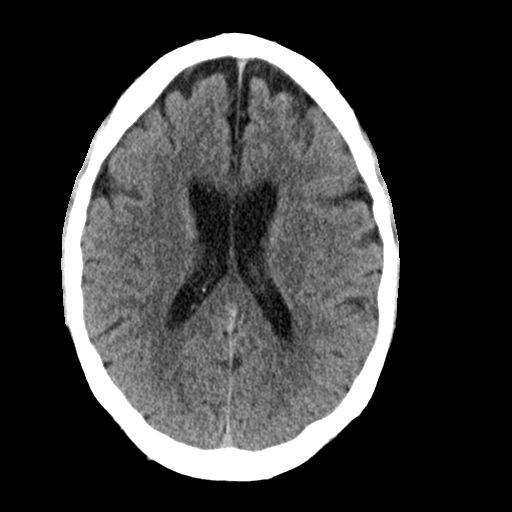
[im 19/31  brain]
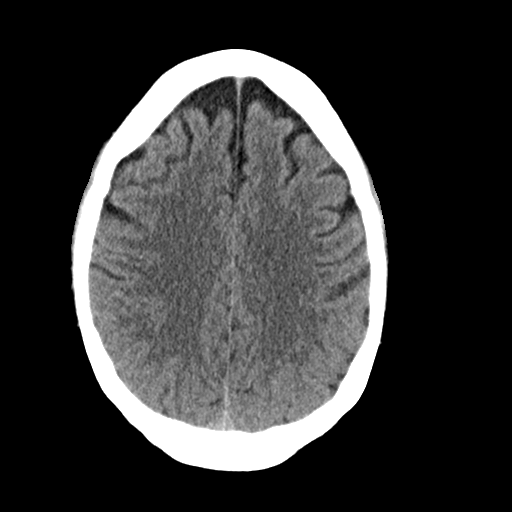
[im 19/31  bone]
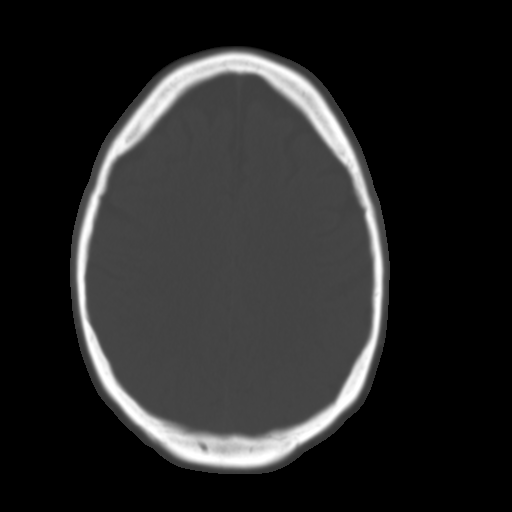
[im 23/31  brain]
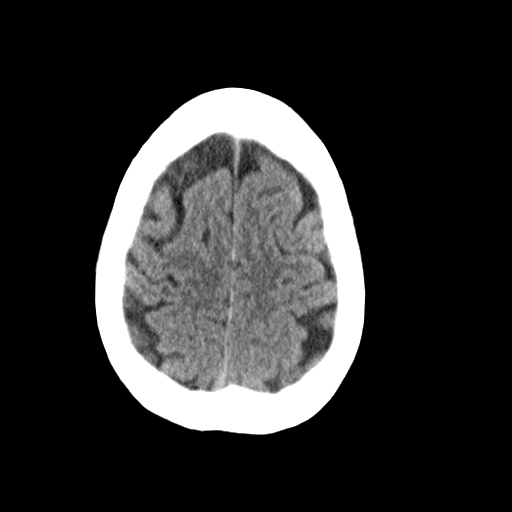
[im 27/31  brain]
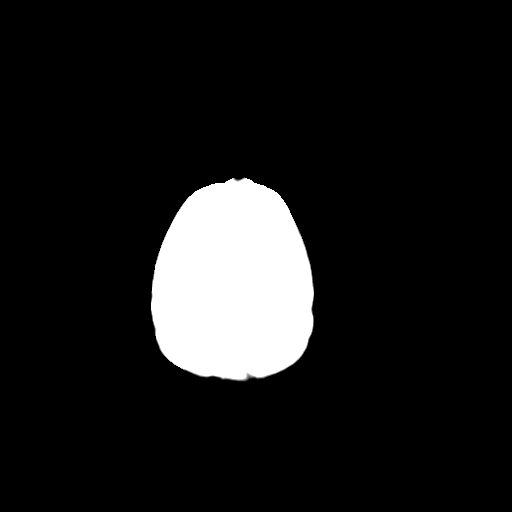

[Series 3: head bone · axial · 0.44mm/px · z∈[-109,-93]mm · 2 of 78 slices shown]
[im 8/78  bone]
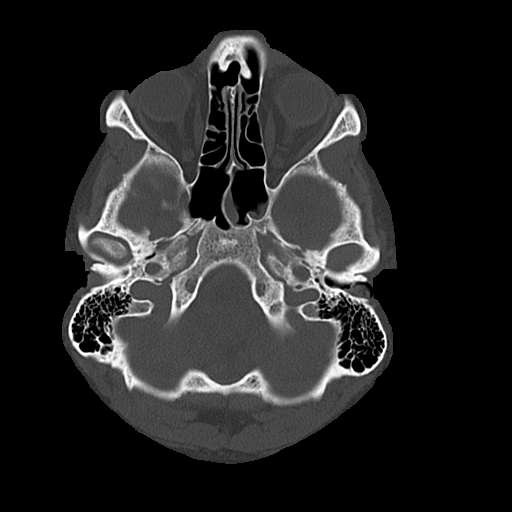
[im 16/78  bone]
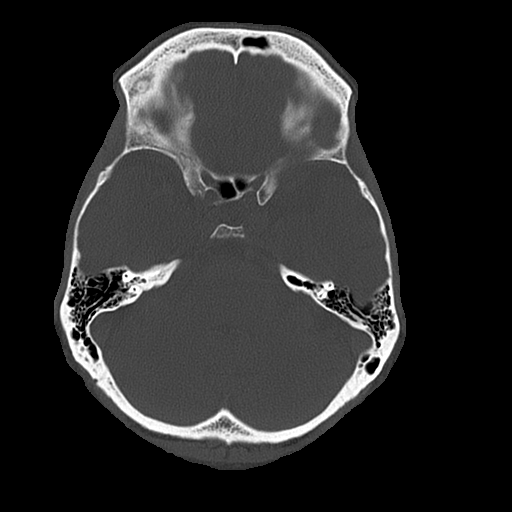

[Series 4: coronal soft · coronal · 0.30mm/px · 3 of 72 slices shown]
[im 24/72  brain]
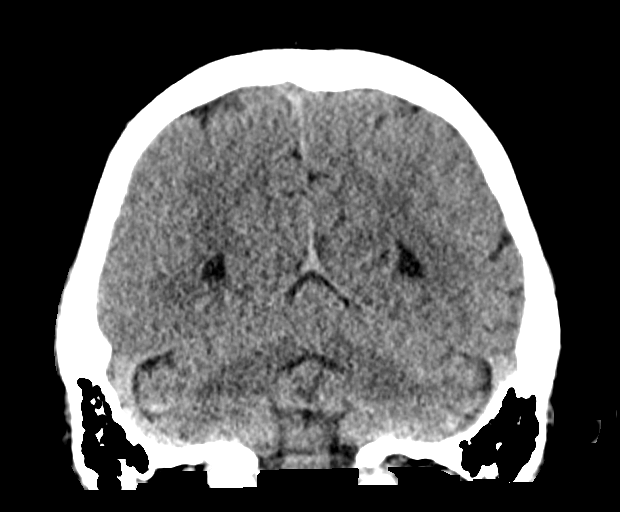
[im 32/72  brain]
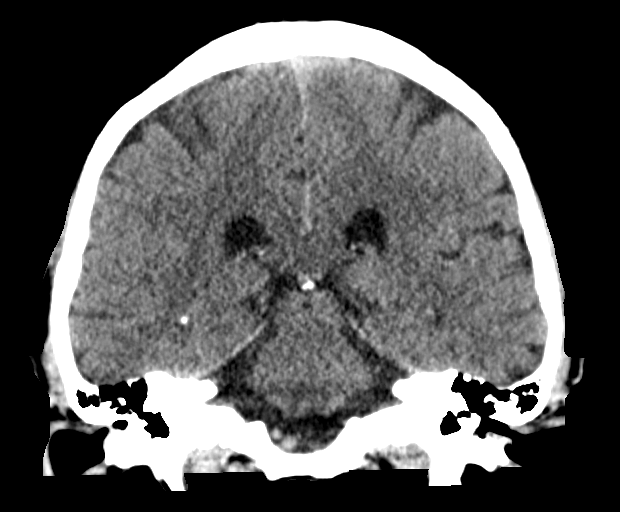
[im 40/72  brain]
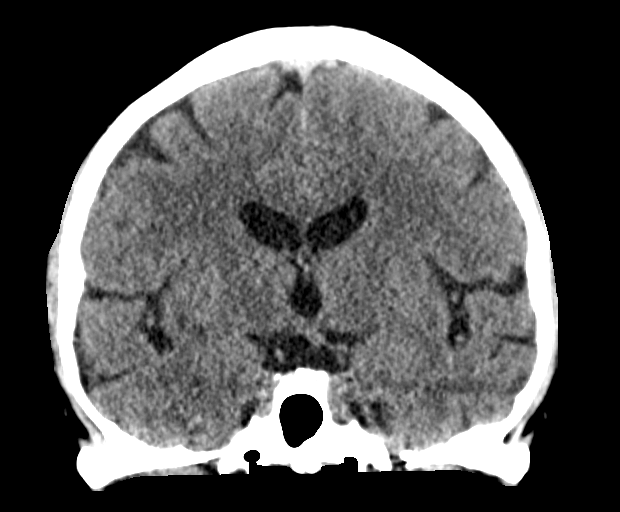

[Series 5: sagittal soft · sagittal · 0.30mm/px · 3 of 62 slices shown]
[im 21/62  brain]
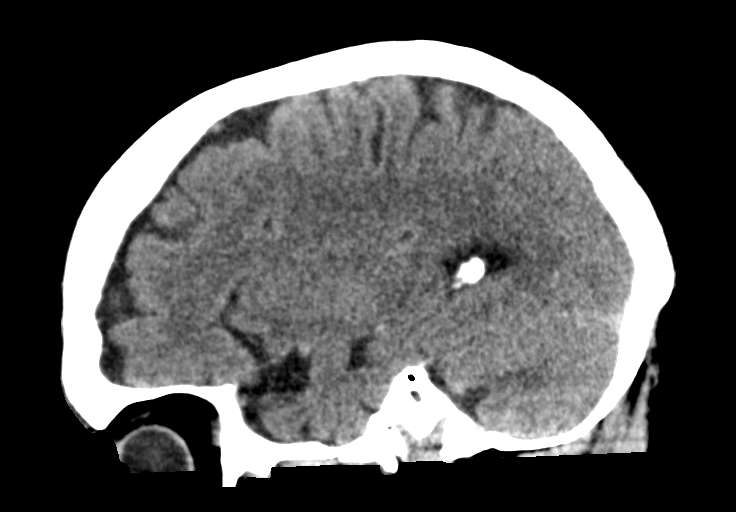
[im 31/62  brain]
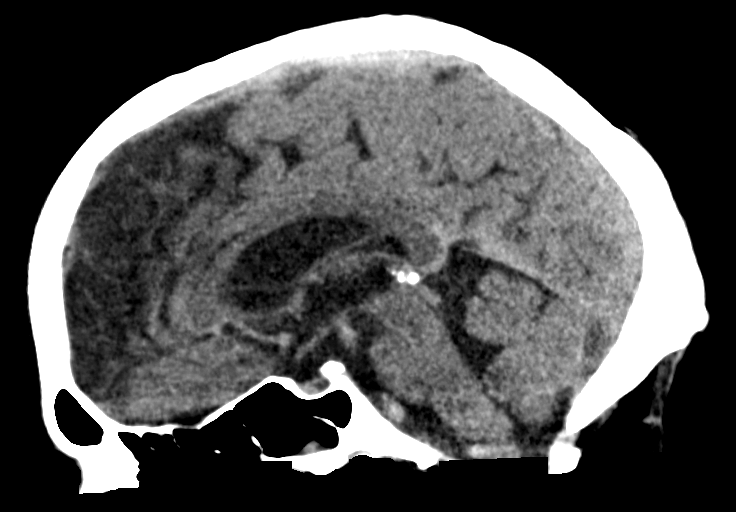
[im 41/62  brain]
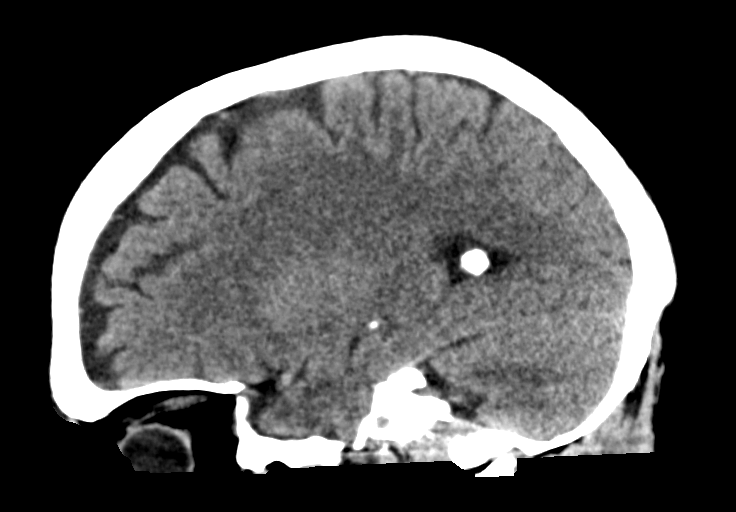

[15 of 47 positions shown; findings below may reference images not displayed]

FINDINGS: Brain: Mild cerebral atrophy. No ventricular dilatation. Subtle
asymmetry suggested in the left insular region with suggestion of
low attenuation change. This could represent early acute infarct.
Consider MRI for further evaluation. No mass effect or midline
shift. No abnormal extra-axial fluid collections. Gray-white matter
junctions are distinct. Basal cisterns are not effaced. No acute
intracranial hemorrhage.

Vascular: No hyperdense vessel or unexpected calcification.

Skull: Normal. Negative for fracture or focal lesion.

Sinuses/Orbits: Mucosal thickening in the paranasal sinuses. No
acute air-fluid levels. Mastoid air cells are clear.

Other: None.
IMPRESSION: Subtle asymmetrical low-attenuation change in the left insular deep
white matter, possibly artifact or early infarct. Consider MRI
correlation. No acute intracranial hemorrhage or mass effect.

## 2021-09-20 MED ORDER — SODIUM CHLORIDE 0.9% FLUSH
3.0000 mL | Freq: Once | INTRAVENOUS | Status: AC
  Administered 2021-09-20: 3 mL via INTRAVENOUS
  Filled 2021-09-20: qty 3

## 2021-09-20 MED ORDER — ASPIRIN 300 MG RE SUPP
300.0000 mg | Freq: Once | RECTAL | Status: AC
  Administered 2021-09-20: 300 mg via RECTAL
  Filled 2021-09-20: qty 1

## 2021-09-20 MED ORDER — IOHEXOL 350 MG/ML SOLN
100.0000 mL | Freq: Once | INTRAVENOUS | Status: AC | PRN
  Administered 2021-09-20: 100 mL via INTRAVENOUS

## 2021-09-20 NOTE — ED Notes (Signed)
Patient transported to MRI 

## 2021-09-20 NOTE — Consult Note (Addendum)
[facility] ?[facility] Consult [facility] ? ? ?Patient Name:   Kyle Arellano, Kyle Arellano ?Date of Birth:   Oct 21, 1955 ?Identification Number:   MRN - [id number] ?Date of Service:   09/20/2021 20:58:47 ? ?Diagnosis: ?      I63.9 - Cerebrovascular accident (CVA), unspecified mechanism ([place]) ? ?Impression: ?     66 y/o man with history of HLD presenting with stuttering aphasia. Not a thrombolytic candidate as his LKN is over 4.5 hours. CT revealing for left insular changes consistent with ischemic infarct. Etiology unclear, may be atheroembolic or cardioembolic and will need MRI for delineation. Recommend admission under stroke protocol for further workup. ? ?Our recommendations are outlined below. ? ?Recommendations: ? ?      Stroke/Telemetry Floor ?      Neuro Checks ?      Bedside Swallow Eval ?      DVT Prophylaxis ?      IV Fluids, Normal Saline ?      Head of Bed 30 Degrees ?      Euglycemia and Avoid Hyperthermia (PRN Acetaminophen) ?      Bolus with Clopidogrel 300 mg bolus x1 and initiate dual antiplatelet therapy with Aspirin 81 mg daily and Clopidogrel 75 mg daily ?      Antihypertensives PRN if Blood pressure is greater than 220/120 or there is a concern for End organ damage/contraindications for permissive HTN. If blood pressure is greater than 220/120 give labetalol PO or IV or Vasotec IV with a goal of 15% reduction in BP during the first 24 hours. ? ?Per facility request will defer further work up, management, and referrals to inpatient service, inclusive of inpatient neurology consult. ? ?Sign Out: ?      Discussed with Emergency Department Provider ? ? ? ?------------------------------------------------------------------------------ ? ?Advanced Imaging: ? ?CTA Head and Neck Completed. ? ?LVO:No ? ? ?Metrics: ?Last Known Well: 09/20/2021 10:00:00 ?[facility] Notification Time: 09/20/2021 20:58:47 ?Arrival Time: 09/20/2021 18:57:00 ?Stamp Time: 09/20/2021 20:58:47 ?Initial Response Time:  09/20/2021 20:59:34 ?Symptoms: aphasia. ?NIHSS Start Assessment Time: 09/20/2021 21:00:08 ?Patient is not a candidate for Thrombolytic. ?Thrombolytic Medical Decision: 09/20/2021 21:04:47 ?Patient was not deemed candidate for Thrombolytic because of following reasons: ?Last Well Known Above 4.5 Hours. ? ?I personally Reviewed the CT Head and it Showed acute/subacute infarct in left insular region. ? ?ED Physician notified of diagnostic impression and management plan on 09/20/2021 21:10:14 ? ? ? ?------------------------------------------------------------------------------ ? ?History of Present Illness: ?Patient is a 66 year old Male. ? ?Patient was brought by private transportation with symptoms of aphasia. ?66 y/o man with history of hypercholesterolemia and recent diverticulitis presenting with speech difficulty starting at 1000 today. Per report, his speech has been halting and intermittent. Denies any facial droop or focal weakness. No know history of stroke. ? ? ? ?Past Medical History: ?     Hyperlipidemia ? ?Medications: ? ?No Anticoagulant use  ?No Antiplatelet use ?Reviewed EMR for current medications ? ?Allergies:  ?Reviewed ? ?Social History: ?Drug Use: No ? ?Family History: ? ?There is no family history of premature cerebrovascular disease pertinent to this consultation ? ?ROS : ?14 Points Review of Systems was performed and was negative except mentioned in HPI. ? ?Past Surgical History: ?There Is No Surgical History Contributory To Today?s Visit ? ? ? ?Examination: ?BP(137/89), Pulse(80), ?1A: Level of Consciousness - Alert; keenly responsive + 0 ?1B: Ask Month and Age - Both Questions Right + 0 ?1C: Blink Eyes & Squeeze Hands - Performs Both Tasks + 0 ?2: Test  Horizontal Extraocular Movements - Normal + 0 ?3: Test Visual Fields - No Visual Loss + 0 ?4: Test Facial Palsy (Use Grimace if Obtunded) - Normal symmetry + 0 ?5A: Test Left Arm Motor Drift - No Drift for 10 Seconds + 0 ?5B: Test Right Arm  Motor Drift - No Drift for 10 Seconds + 0 ?6A: Test Left Leg Motor Drift - No Drift for 5 Seconds + 0 ?6B: Test Right Leg Motor Drift - No Drift for 5 Seconds + 0 ?7: Test Limb Ataxia (FNF/Heel-Shin) - No Ataxia + 0 ?8: Test Sensation - Normal; No sensory loss + 0 ?9: Test Language/Aphasia - Mild-Moderate Aphasia: Some Obvious Changes, Without Significant Limitation + 1 ?10: Test Dysarthria - Normal + 0 ?11: Test Extinction/Inattention - No abnormality + 0 ? ?NIHSS Score: 1 ? ? ?Pre-Morbid Modified Rankin Scale: ?0 Points = No symptoms at all ? ? ?Patient/Family was informed the Neurology Consult would occur via TeleHealth consult by way of interactive audio and video telecommunications and consented to receiving care in this manner. ? ? ?Patient is being evaluated for possible acute neurologic impairment and high probability of imminent or life-threatening deterioration. I spent total of 40 minutes providing care to this patient, including time for face to face visit via telemedicine, review of medical records, imaging studies and discussion of findings with providers, the patient and/or family. ? ? ?Dr Shari Prows ? ? ?[facility] ?[phone] ? ? ?Case [id number] ? ?

## 2021-09-20 NOTE — ED Notes (Signed)
Pt arrived as transfer from [place] for MRI after positive NIH and abnormal CT scan. Pt LSN was 10 AM this morning. Pt is A&O4, GCS 15 at this time. He is significant expressive aphasia noted. No facial droop, PMS intact and equal in extremities. Room air, denies pain, no distress.  ?

## 2021-09-20 NOTE — Progress Notes (Signed)
Code Stroke Activated '@2043'$ . Patient LKWT 1000. Patient has had intermittent aphasia and slurred speech since that time. Patient in room when Code Stroke activated. Dr. Ria Bush on stroke cart '@2059'$ . He has reviewed the CT head results. Patient left for CTA/CTP '@2108'$ . CTA/ CTP results relayed to Dr. Ria Bush '@2152'$ .   ?

## 2021-09-20 NOTE — ED Provider Notes (Signed)
?[facility] ?Provider Note ? ? ?CSN: [id number] ?Arrival date & time: 09/20/21  1857 ? ?  ? ?History ? ?Chief Complaint  ?Patient presents with  ? Aphasia  ? ? ?Kyle Arellano is a 66 y.o. male. ? ?Patient presents with chief complaint of aphasia slurred speech.  He states he noticed this around 10 AM symptoms did not improve.  Caregivers came to check on him this evening and found he could not understand his words.  They state that sometimes he would not be able to get a few words out clearly but then he would revert back to slurred speech that he could understand.  Denies any fevers or pain. ? ? ?  ? ?Home Medications ?Prior to Admission medications   ?Medication Sig Start Date End Date Taking? Authorizing Provider  ?benztropine (COGENTIN) 2 MG tablet Take 2 mg by mouth daily.    [provider]  ?buPROPion (WELLBUTRIN SR) 150 MG 12 hr tablet Take 150 mg by mouth 2 (two) times daily.    [provider]  ?fluPHENAZine (PROLIXIN) 10 MG tablet Take 10 mg by mouth every morning. 04/17/19   [provider]  ?rosuvastatin (CRESTOR) 10 MG tablet Take 1 tablet (10 mg total) by mouth daily. (NEEDS TO BE SEEN BEFORE NEXT REFILL) 05/23/21   Claretta Fraise, MD  ?TRINTELLIX 10 MG TABS tablet Take 10 mg by mouth every morning. 04/10/19   [provider]  ?   ? ?Allergies    ?Patient has no known allergies.   ? ?Review of Systems   ?Review of Systems  ?Constitutional:  Negative for fever.  ?HENT:  Negative for ear pain and sore throat.   ?Eyes:  Negative for pain.  ?Respiratory:  Negative for cough.   ?Cardiovascular:  Negative for chest pain.  ?Gastrointestinal:  Negative for abdominal pain.  ?Genitourinary:  Negative for flank pain.  ?Musculoskeletal:  Negative for back pain.  ?Skin:  Negative for color change and rash.  ?Neurological:  Negative for syncope.  ?All other systems reviewed and are negative. ? ?Physical Exam ?Updated Vital Signs ?BP 137/81   Pulse 78    Temp 97.9 ?F (36.6 ?C)   Resp (!) 29   Wt 94.3 kg   SpO2 94%   BMI 32.58 kg/m?  ?Physical Exam ?Constitutional:   ?   Appearance: He is well-developed.  ?HENT:  ?   Head: Normocephalic.  ?   Nose: Nose normal.  ?Eyes:  ?   Extraocular Movements: Extraocular movements intact.  ?Cardiovascular:  ?   Rate and Rhythm: Normal rate.  ?Pulmonary:  ?   Effort: Pulmonary effort is normal.  ?Skin: ?   Coloration: Skin is not jaundiced.  ?Neurological:  ?   Mental Status: He is alert.  ?   Comments: Patient awake and alert no facial droop noted.  Extraocular motion is intact.  Strength is 5/5 all extremities.  Patient is awake and alert and following commands appropriately.  Speech however slurred and incomprehensible.  ? ? ?ED Results / Procedures / Treatments   ?Labs ?(all labs ordered are listed, but only abnormal results are displayed) ?Labs Reviewed  ?COMPREHENSIVE METABOLIC PANEL - Abnormal; Notable for the following components:  ?    Result Value  ? Glucose, Bld 181 (*)   ? Creatinine, Ser 1.31 (*)   ? All other components within normal limits  ?CBG MONITORING, ED - Abnormal; Notable for the following components:  ? Glucose-Capillary 177 (*)   ?  All other components within normal limits  ?PROTIME-INR  ?APTT  ?CBC  ?DIFFERENTIAL  ? ? ?EKG ?EKG Interpretation ? ?Date/Time:  Saturday September 20 2021 19:06:19 EDT ?Ventricular Rate:  96 ?PR Interval:  164 ?QRS Duration: 70 ?QT Interval:  330 ?QTC Calculation: 416 ?R Axis:   98 ?Text Interpretation: Normal sinus rhythm Rightward axis Borderline ECG No previous ECGs available Confirmed by Thamas Jaegers ([id number]) on 09/20/2021 7:25:38 PM ? ?Radiology ?CT HEAD WO CONTRAST ? ?Result Date: 09/20/2021 ?CLINICAL DATA:  Transient ischemic attack. Speaking difficulty since 10 a.m. this morning. EXAM: CT HEAD WITHOUT CONTRAST TECHNIQUE: Contiguous axial images were obtained from the base of the skull through the vertex without intravenous contrast. RADIATION DOSE REDUCTION: This exam was  performed according to the departmental dose-optimization program which includes automated exposure control, adjustment of the mA and/or kV according to patient size and/or use of iterative reconstruction technique. COMPARISON:  None. FINDINGS: Brain: Mild cerebral atrophy. No ventricular dilatation. Subtle asymmetry suggested in the left insular region with suggestion of low attenuation change. This could represent early acute infarct. Consider MRI for further evaluation. No mass effect or midline shift. No abnormal extra-axial fluid collections. Gray-white matter junctions are distinct. Basal cisterns are not effaced. No acute intracranial hemorrhage. Vascular: No hyperdense vessel or unexpected calcification. Skull: Normal. Negative for fracture or focal lesion. Sinuses/Orbits: Mucosal thickening in the paranasal sinuses. No acute air-fluid levels. Mastoid air cells are clear. Other: None. IMPRESSION: Subtle asymmetrical low-attenuation change in the left insular deep white matter, possibly artifact or early infarct. Consider MRI correlation. No acute intracranial hemorrhage or mass effect. Electronically Signed   By: Lucienne Capers M.D.   On: 09/20/2021 20:05   ? ?Procedures ?Marland KitchenCritical Care ?Performed by: Luna Fuse, MD ?Authorized by: Luna Fuse, MD  ? ?Critical care provider statement:  ?  Critical care time (minutes):  40 ?  Critical care time was exclusive of:  Separately billable procedures and treating other patients and teaching time ?  Critical care was necessary to treat or prevent imminent or life-threatening deterioration of the following conditions:  CNS failure or compromise  ? ? ?Medications Ordered in ED ?Medications  ?sodium chloride flush (NS) 0.9 % injection 3 mL (3 mLs Intravenous Given 09/20/21 1940)  ?iohexol (OMNIPAQUE) 350 MG/ML injection 100 mL (100 mLs Intravenous Contrast Given 09/20/21 2113)  ? ? ?ED Course/ Medical Decision Making/ A&P ?  ?                        ?Medical  Decision Making ?Amount and/or Complexity of Data Reviewed ?Labs: ordered. ?Radiology: ordered. ? ?Risk ?Prescription drug management. ? ? ?Review of records shows admission for perforated sigmoid colon early this month. ? ?History obtained from caregivers at bedside. ? ?Cardiac monitoring shows sinus rhythm. ? ?Patient not a candidate for tPA given last known well was about 10 AM this morning. ? ?CT head shows concern for possible acute stroke.  Case discussed with on-call neurologist Dr. Milas Gain who recommends stroke activation and CT angio. ? ?CT angio has been finished, patient transferred to [facility] for Neurology evaluation. ? ? ? ? ? ? ? ?Final Clinical Impression(s) / ED Diagnoses ?Final diagnoses:  ?Acute stroke due to ischemia [facility])  ? ? ?Rx / DC Orders ?ED Discharge Orders   ? ? None  ? ?  ? ? ?  ?Luna Fuse, MD ?09/20/21 2144 ? ?

## 2021-09-20 NOTE — ED Triage Notes (Signed)
Pt has been having some intermittent difficulty speaking since 10am this am.  Pt is able to speak at this time but his family states that he will be speaking and then suddenly he will have some slurred words and also at times can't get any words out.  No facial droop and no focal weakness in triage.  ?

## 2021-09-20 NOTE — ED Notes (Addendum)
Per MD Clayton, hold off on swallow screen for now. ?

## 2021-09-20 NOTE — ED Notes (Signed)
Code Stroke activated, tele neuro cart in room, RN Nira Conn currently assessing patient virtually.  ?

## 2021-09-20 NOTE — ED Notes (Signed)
[facility] at bedside 

## 2021-09-21 ENCOUNTER — Observation Stay (HOSPITAL_COMMUNITY): Payer: Medicare Other

## 2021-09-21 DIAGNOSIS — E785 Hyperlipidemia, unspecified: Secondary | ICD-10-CM

## 2021-09-21 DIAGNOSIS — R4701 Aphasia: Secondary | ICD-10-CM

## 2021-09-21 DIAGNOSIS — R479 Unspecified speech disturbances: Secondary | ICD-10-CM | POA: Diagnosis not present

## 2021-09-21 DIAGNOSIS — R29818 Other symptoms and signs involving the nervous system: Secondary | ICD-10-CM | POA: Diagnosis not present

## 2021-09-21 LAB — LIPID PANEL
Cholesterol: 173 mg/dL (ref 0–200)
HDL: 34 mg/dL — ABNORMAL LOW (ref >40)
LDL Cholesterol: 103 mg/dL — ABNORMAL HIGH (ref 0–99)
Total CHOL/HDL Ratio: 5.1 RATIO
Triglycerides: 182 mg/dL — ABNORMAL HIGH (ref <150)
VLDL: 36 mg/dL (ref 0–40)

## 2021-09-21 LAB — HIV ANTIBODY (ROUTINE TESTING W REFLEX): HIV Screen 4th Generation wRfx: NONREACTIVE

## 2021-09-21 IMAGING — MR MR HEAD W/ CM
4 of 6 series · 22 of 48 positions shown · IV contrast (Gadavist)
Comparison: Brain MRI and CTA head and neck [DATE].

CLINICAL DATA: 66-year-old male with neurologic deficit, aphasia.
Noncontrast brain MRI [BL] hours yesterday with small focus of
nonspecific left insular signal abnormality.

EXAM:
MRI HEAD WITH CONTRAST
TECHNIQUE: Multiplanar, multiecho pulse sequences of the brain and surrounding
structures were obtained with intravenous contrast.
CONTRAST:  9.5mL GADAVIST GADOBUTROL 1 MMOL/ML IV SOLN

[Series 6: T2 post-contrast · coronal · 5.0mm · 0.72mm/px · 6 of 28 slices shown]
[im 1/28]
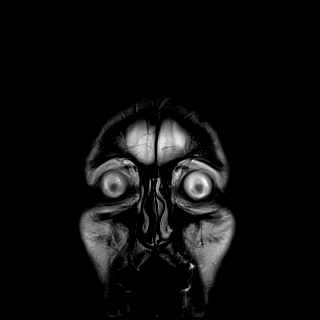
[im 6/28]
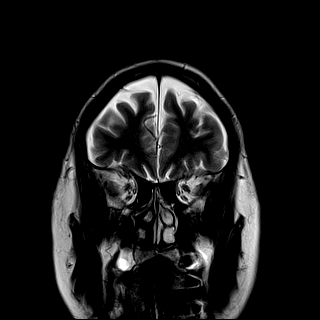
[im 11/28]
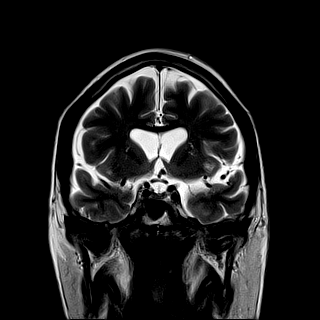
[im 17/28]
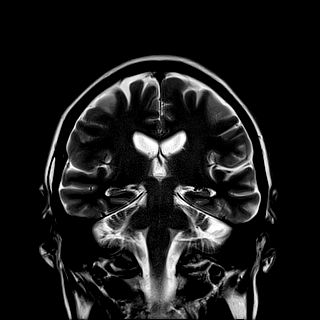
[im 22/28]
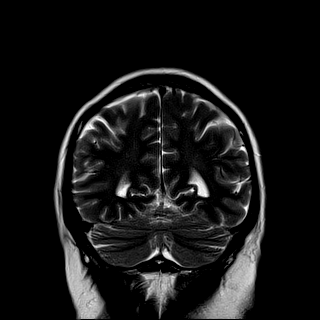
[im 28/28]
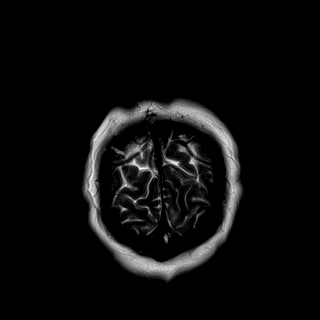

[Series 8: FLAIR · axial · 5.0mm · 0.45mm/px · z∈[-89,+55]mm · 5 of 25 slices shown]
[im 1/25]
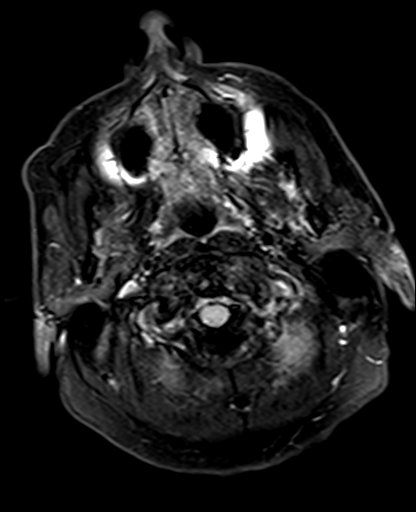
[im 7/25]
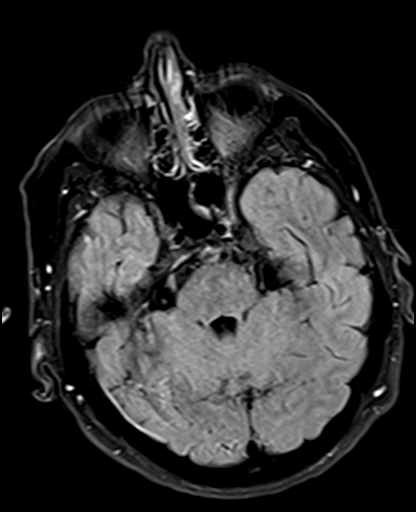
[im 13/25]
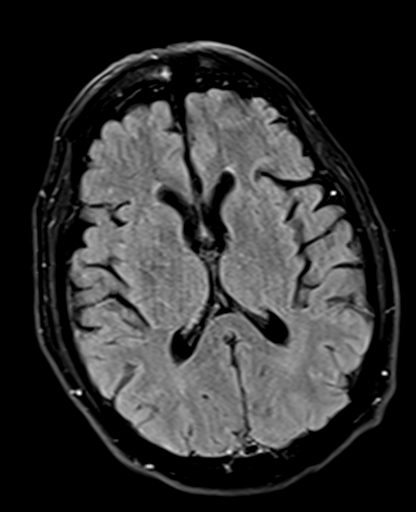
[im 19/25]
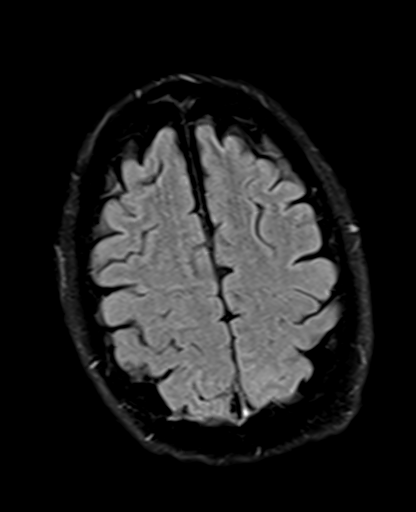
[im 25/25]
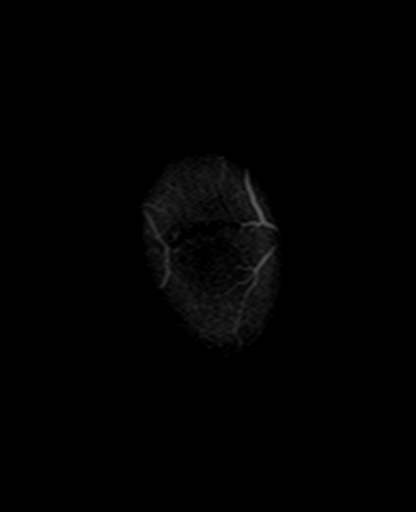

[Series 9: T1 post-contrast · sagittal · 5.0mm · 0.75mm/px · 5 of 23 slices shown (1 of 2)]
[im 1/23]
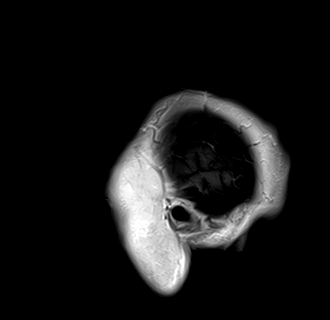
[im 6/23]
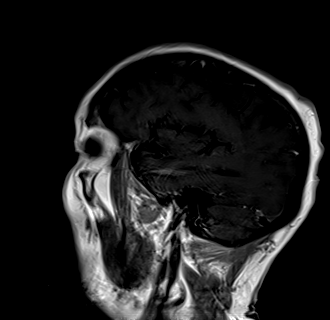
[im 12/23]
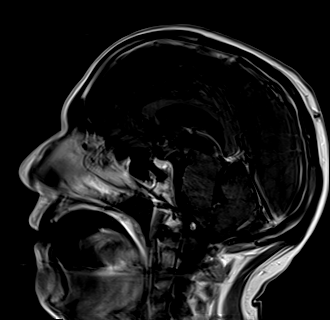
[im 17/23]
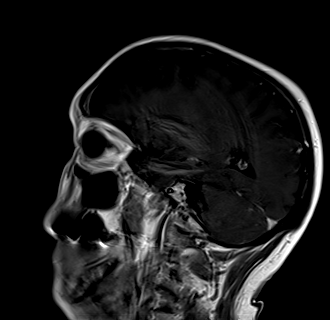
[im 23/23]
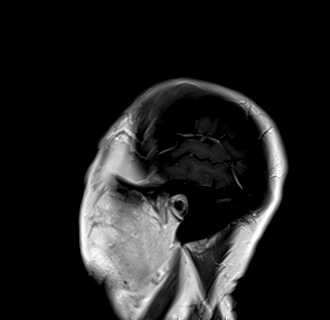

[Series 10: T1 post-contrast · coronal · 5.0mm · 0.34mm/px · 6 of 28 slices shown (2 of 2)]
[im 1/28]
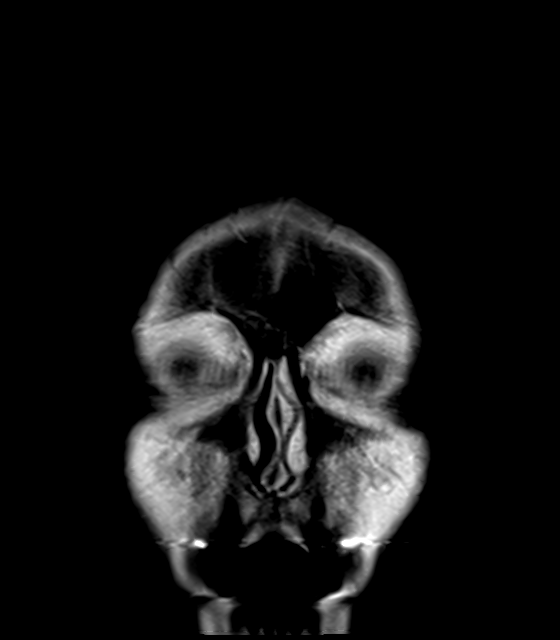
[im 6/28]
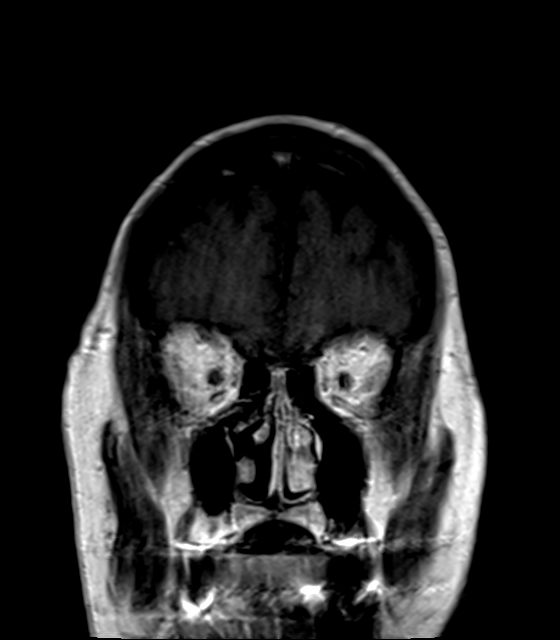
[im 11/28]
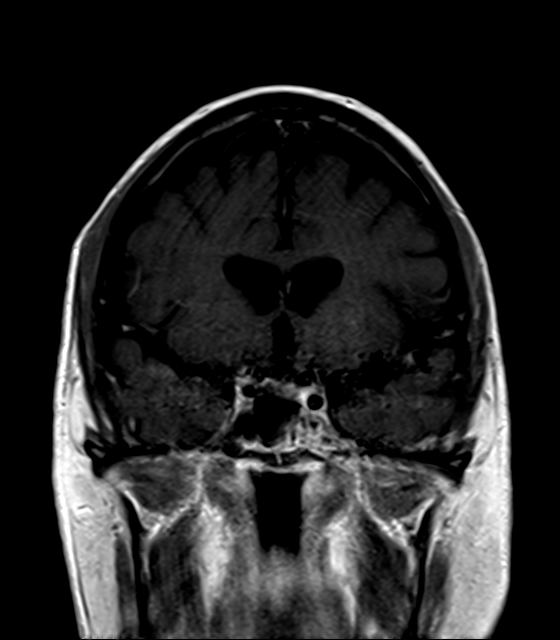
[im 17/28]
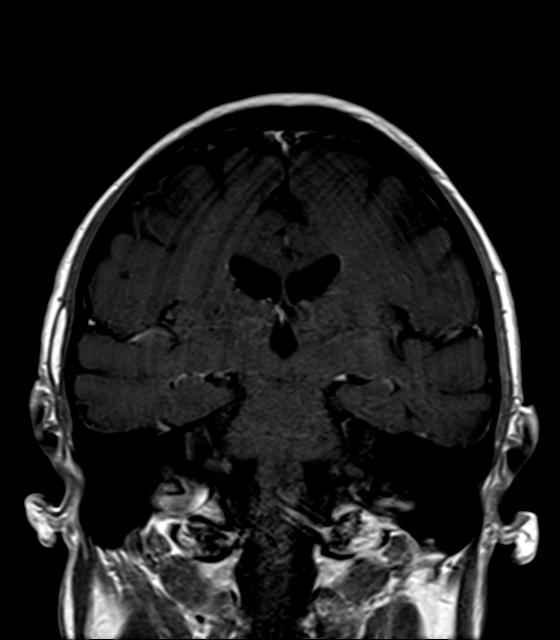
[im 22/28]
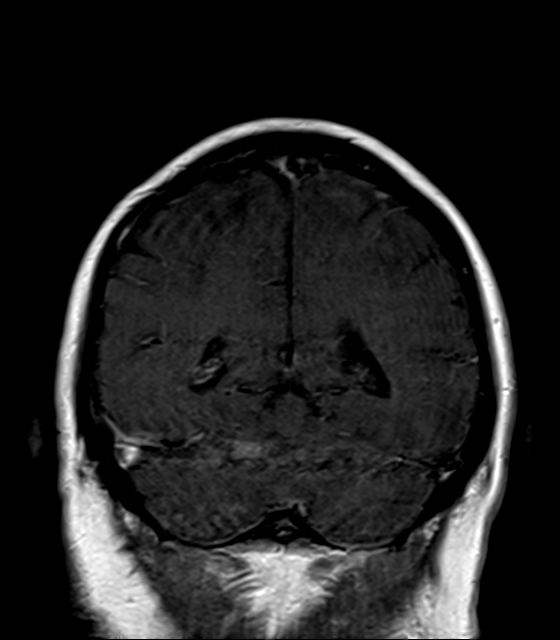
[im 28/28]
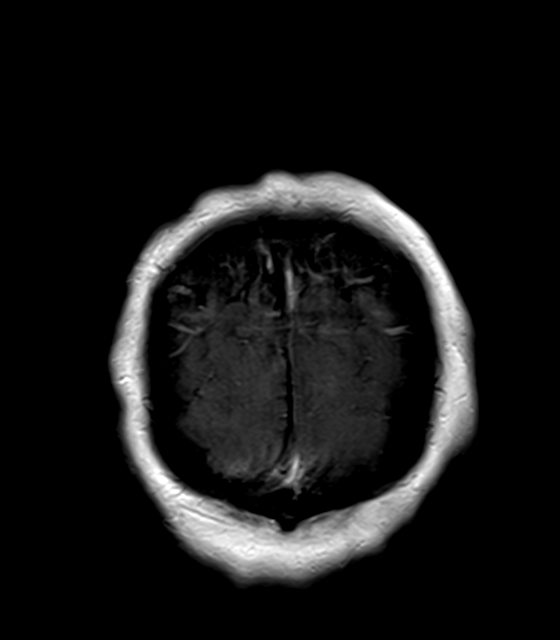

[22 of 48 positions shown; findings below may reference images not displayed]

FINDINGS: Precontrast axial T1 weighted imaging was repeated. The major dural
venous sinuses are enhancing and appear to be patent. Expected
enhancement of the major intracranial arterial structures.

No midline shift, mass effect, or evidence of intracranial mass
lesion. No abnormal enhancement identified. Mild T1 hypointensity at
the anterior left insula without enhancement noted ( series 7, image
28). No dural thickening.

Visible cervical spine and spinal cord appear negative. Pituitary
normally enhances.
IMPRESSION: No enhancement of the small area of nonspecific T2/FLAIR
hyperintensity at the anterior left insula.

No abnormal intracranial enhancement or dural thickening.

## 2021-09-21 MED ORDER — SODIUM CHLORIDE 0.9 % IV SOLN
2000.0000 mg | Freq: Once | INTRAVENOUS | Status: AC
  Administered 2021-09-21: 2000 mg via INTRAVENOUS
  Filled 2021-09-21: qty 20

## 2021-09-21 MED ORDER — ACETAMINOPHEN 325 MG PO TABS
650.0000 mg | ORAL_TABLET | Freq: Four times a day (QID) | ORAL | Status: DC | PRN
Start: 2021-09-21 — End: 2021-09-22

## 2021-09-21 MED ORDER — VORTIOXETINE HBR 20 MG PO TABS
20.0000 mg | ORAL_TABLET | Freq: Every morning | ORAL | Status: DC
  Administered 2021-09-21 – 2021-09-22 (×2): 20 mg via ORAL
  Filled 2021-09-21 (×2): qty 1

## 2021-09-21 MED ORDER — BENZTROPINE MESYLATE 2 MG PO TABS
2.0000 mg | ORAL_TABLET | Freq: Every day | ORAL | Status: DC
  Administered 2021-09-21: 2 mg via ORAL
  Filled 2021-09-21 (×2): qty 1

## 2021-09-21 MED ORDER — ACETAMINOPHEN 650 MG RE SUPP: 650.0000 mg | Freq: Four times a day (QID) | RECTAL | Status: DC | PRN

## 2021-09-21 MED ORDER — ENOXAPARIN SODIUM 40 MG/0.4ML IJ SOSY
40.0000 mg | PREFILLED_SYRINGE | INTRAMUSCULAR | Status: DC
  Administered 2021-09-21 – 2021-09-22 (×2): 40 mg via SUBCUTANEOUS
  Filled 2021-09-21 (×2): qty 0.4

## 2021-09-21 MED ORDER — GADOBUTROL 1 MMOL/ML IV SOLN
9.5000 mL | Freq: Once | INTRAVENOUS | Status: AC | PRN
  Administered 2021-09-21: 9.5 mL via INTRAVENOUS

## 2021-09-21 NOTE — Procedures (Signed)
History: 66 year old male being evaluated for speech abnormality ? ?Sedation: None ? ?Technique: This EEG was acquired with electrodes placed according to the International 10-20 electrode system (including Fp1, Fp2, F3, F4, C3, C4, P3, P4, O1, O2, T3, T4, T5, T6, A1, A2, Fz, Cz, Pz). The following electrodes were missing or displaced: none. ? ? ?Background: The background consists of intermixed alpha and beta activities. There is a well defined posterior dominant rhythm of eight hz that attenuates with eye opening.  There is anterior shifting of the posterior dominant rhythm with drowsiness, but sleep is not recorded. ? ?Photic stimulation: Physiologic driving is not performed ? ?EEG Abnormalities: none ? ?Clinical Interpretation: This normal EEG is recorded in the waking and drowsy state. There was no seizure or seizure predisposition recorded on this study. Please note that lack of epileptiform activity on EEG does not preclude the possibility of epilepsy.  ? ?Roland Rack, MD ?[facility] ?[phone] ? ?If 7pm- 7am, please page neurology on call as listed in [place]. ? ?

## 2021-09-21 NOTE — ED Notes (Signed)
Patient transported to MRI 

## 2021-09-21 NOTE — Care Management Obs Status (Signed)
MEDICARE OBSERVATION STATUS NOTIFICATION ? ? ?Patient Details  ?Name: Kyle Arellano ?MRN: [id number] ?Date of Birth: 10-17-1955 ? ? ?Medicare Observation Status Notification Given:  Yes ? ? ? ?Rockie Vawter Darnell Level., RN ?09/21/2021, 4:07 PM ?

## 2021-09-21 NOTE — Progress Notes (Addendum)
EEG complete, pending results  ?

## 2021-09-21 NOTE — Hospital Course (Signed)
Kyle Arellano is a 66 year old male with past medical history significant for depression, schizophrenia, hyperlipidemia who presented to [facility] ED on 09/20/2021 for the evaluation of aphasia since 10 AM in the morning.  Patient reports he was talking to someone on the telephone when all of a sudden he was not able to speak.  Afterwards, when he tried to speak his speech was slurred but since has improved to close to baseline.  Denies any vision changes, no focal weakness/numbness.  Reports diarrhea about a month ago for 3 days following diagnosis with COVID, but now has recovered.  Denies fevers, no cough, no shortness of breath, no chest pain, no nausea/vomiting, no abdominal pain.  No previous history of stroke. ? ?In the ED, temperature 97.9 ?F, HR 96, RR 24, BP 141/99, SPO2 99% on room air.  Sodium 138, potassium 4.0, chloride 103, CO2 25, glucose 21, BUN 20, creatinine 1.31.  WBC 8.0, hemoglobin 14.9, platelets 228.  CT head without contrast with subtle asymmetrical low-attenuation change left insular deep white matter, partially infarct versus artifact; no intracranial hemorrhage or mass effect.  MR brain without contrast negative for acute intracranial normality.  MR brain with contrast with no enhancement of the small area of nonspecific T2/FLAIR hyperintensity of the anterior left insula, no abnormal intracranial enhancement or dural thickening.  CT angiogram head/neck with no large vessel occlusion, no significant atheromatous disease or hemodynamically significant stenosis.  Neurology was consulted and feels patient's presentation consistent with pure word mutism/aphemia; and did not feel this was disorganized speech secondary to schizophrenia.  Recommended admission for EEG.  Patient was given Keppra 2 g IV.  Patient was transferred to [facility].  [facility] consulted for further evaluation and management. ?

## 2021-09-21 NOTE — Assessment & Plan Note (Addendum)
Cogentin 2 mg p.o. nightly and Trintellix 20 mg p.o. daily ?

## 2021-09-21 NOTE — Consult Note (Signed)
NEUROLOGY CONSULTATION NOTE  ? ?Date of service: September 21, 2021 ?Patient Name: Kyle Arellano ?MRN:  [id number] ?DOB:  23-Apr-1956 ?Reason for consult: "Aphasia" ?Requesting Provider: Luna Fuse, MD ?_ _ _   _ __   _ __ _ _  __ __   _ __   __ _ ? ?History of Present Illness  ?Kyle Arellano is a 66 y.o. male with PMH significant for depression, HLD, schizophrenia who presents with aphasia. He slept very little last night. He woke up at 0700 and was fine. He was talking on the phone around 1000 on 09/20/21 when he had sudden onset difficulty finding words. Speech was garbled and this has been going on for most of the day. Improves a little but then gets worse. Seems overall that this is getting better but still has mild aphasia to moderate aphasia. ? ?Initially presented to [place] where [facility] concerning for potential L insular subtle hypodensity. CTA with no LVO. He was seen by Teleneurology and transferred to [facility]. MRI Brain without contrast with no acute stroke. ? ? ?ROS  ? ?Constitutional Denies weight loss, fever and chills.   ?HEENT Denies changes in vision and hearing.   ?Respiratory Denies SOB and cough.   ?CV Denies palpitations and CP   ?GI Denies abdominal pain, nausea, vomiting and diarrhea.   ?GU Denies dysuria and urinary frequency.   ?MSK Denies myalgia and joint pain.   ?Skin Denies rash and pruritus.   ?Neurological Denies headache and syncope.   ?Psychiatric Denies recent changes in mood. Denies anxiety and depression.   ? ?Past History  ? ?Past Medical History:  ?Diagnosis Date  ? Depression   ? Hyperlipidemia   ? Schizophrenia ([place])   ? ?History reviewed. No pertinent surgical history. ?Family History  ?Problem Relation Age of Onset  ? Hypertension Mother   ? Cancer Father   ? Cirrhosis Father   ? Diabetes Father   ? ?Social History  ? ?Socioeconomic History  ? Marital status: Single  ?  Spouse name: Not on file  ? Number of children: Not on file  ? Years of education: Not on file   ? Highest education level: Not on file  ?Occupational History  ? Not on file  ?Tobacco Use  ? Smoking status: Former  ? Smokeless tobacco: Never  ?Vaping Use  ? Vaping Use: Never used  ?Substance and Sexual Activity  ? Alcohol use: Never  ? Drug use: Never  ? Sexual activity: Not on file  ?Other Topics Concern  ? Not on file  ?Social History Narrative  ? Not on file  ? ?Social Determinants of Health  ? ?Financial Resource Strain: Not on file  ?Food Insecurity: Not on file  ?Transportation Needs: Not on file  ?Physical Activity: Not on file  ?Stress: Not on file  ?Social Connections: Not on file  ? ?No Known Allergies ? ?Medications  ?(Not in a hospital admission) ?  ? ?Vitals  ? ?Vitals:  ? 09/21/21 0030 09/21/21 0045 09/21/21 0100 09/21/21 0130  ?BP: 116/68 120/82 111/71 (!) 146/100  ?Pulse: 63 64 (!) 59 82  ?Resp: '15 18 20 '$ (!) 21  ?Temp:      ?TempSrc:      ?SpO2: 95% 97% 92% 97%  ?Weight:      ?  ? ?Body mass index is 32.58 kg/m?. ? ?Physical Exam  ? ?General: Laying comfortably in bed; in no acute distress.  ?HENT: Normal oropharynx and  mucosa. Normal external appearance of ears and nose.  ?Neck: Supple, no pain or tenderness  ?CV: No JVD. No peripheral edema.  ?Pulmonary: Symmetric Chest rise. Normal respiratory effort.  ?Abdomen: Soft to touch, non-tender.  ?Ext: No cyanosis, edema, or deformity  ?Skin: No rash. Normal palpation of skin.   ?Musculoskeletal: Normal digits and nails by inspection. No clubbing.  ? ?Neurologic Examination  ?Mental status/Cognition: Alert, oriented to self, place, month and year, good attention.  ?Speech/language: Non fluent, get stuck on a few words, comprehension intact, object naming intact, stuck on words when attempting to repeat. Difficult reading but can write fine. ?Cranial nerves:  ? CN II Pupils equal and reactive to light, no VF deficits   ? CN III,IV,VI EOM intact, no gaze preference or deviation, no nystagmus   ? CN V normal sensation in V1, V2, and V3 segments  bilaterally   ? CN VII no asymmetry, no nasolabial fold flattening   ? CN VIII normal hearing to speech   ? CN IX & X normal palatal elevation, no uvular deviation   ? CN XI 5/5 head turn and 5/5 shoulder shrug bilaterally   ? CN XII midline tongue protrusion   ? ?Motor:  ?Muscle bulk: normal, tone none, pronator drift none tremor none ?Mvmt Root Nerve  Muscle Right Left Comments  ?SA C5/6 Ax Deltoid 5 5   ?EF C5/6 Mc Biceps 5 5   ?EE C6/7/8 Rad Triceps 5 5   ?WF C6/7 Med FCR     ?WE C7/8 PIN ECU     ?F Ab C8/T1 U ADM/FDI 5 5   ?HF L1/2/3 Fem Illopsoas 5 5   ?KE L2/3/4 Fem Quad 5 5   ?DF L4/5 D Peron Tib Ant 5 5   ?PF S1/2 Tibial Grc/Sol 5 5   ? ?Reflexes: ? Right Left Comments  ?Pectoralis     ? Biceps (C5/6) 2 2   ?Brachioradialis (C5/6) 2 2   ? Triceps (C6/7) 2 2   ? Patellar (L3/4) 3 3   ? Achilles (S1) 2+ 2+   ? Hoffman     ? Plantar     ?Jaw jerk   ? ?Sensation: ? Light touch Intact throughout  ? Pin prick   ? Temperature   ? Vibration   ?Proprioception   ? ?Coordination/Complex Motor:  ?- Finger to Nose intact BL: ?- Heel to shin intact BL ?- Rapid alternating movement are normal ?- Gait: deferred. ? ?Labs  ? ?CBC:  ?Recent Labs  ?Lab 09/20/21 ?1937  ?WBC 8.0  ?NEUTROABS 6.2  ?HGB 14.9  ?HCT 44.4  ?MCV 88.4  ?PLT 228  ? ? ?Basic Metabolic Panel:  ?Lab Results  ?Component Value Date  ? NA 138 09/20/2021  ? K 4.0 09/20/2021  ? CO2 25 09/20/2021  ? GLUCOSE 181 (H) 09/20/2021  ? BUN 20 09/20/2021  ? CREATININE 1.31 (H) 09/20/2021  ? CALCIUM 9.3 09/20/2021  ? GFRNONAA >60 09/20/2021  ? GFRAA 72 05/04/2019  ? ?Lipid Panel:  ?Lab Results  ?Component Value Date  ? LDLCALC 138 (H) 09/02/2020  ? ?HgbA1c:  ?Lab Results  ?Component Value Date  ? HGBA1C 5.4 09/02/2020  ? ?Urine Drug Screen: No results found for: LABOPIA, COCAINSCRNUR, [place], [place], THCU, LABBARB  ?Alcohol Level No results found for: ETH ? ?CT Head without contrast(Personally reviewed): ?CTH was negative for a large hypodensity concerning for a large  territory infarct or hyperdensity concerning for an ICH. I do not see the noted  subtle Left insular hypodensity mentioned by radiology team. ? ?CT angio Head and Neck with contrast(Personally reviewed): ?No LVO ? ?MRI Brain(Personally reviewed): ?No acute intracranial abnormality ? ?cEEG:  ?pending ? ?Impression  ? ?Kyle Arellano is a 66 y.o. male with PMH significant for with PMH significant for depression, HLD, schizophrenia who presents with pure word mutism/aphemia. Started abruptly yesterday AM but has been improving. Differential includes TIA vs epileptic or post ictal vs speech apraxia. Unlikely for primary progressive aphasia to present so rapidly, do not think this is disorganized speech 2/2 schizophrenia. ? ?Workup with MRI Brain w/o contrast is negative.  ? ?Recommendations  ?- Will get MRI Brain with contrast and a cEEG for furthher evaluation. Would recommend observation overnight to monitor for improvement in aphemia. ?________________________________________________________________ ? ?Plan discussed with Montine Circle with the ED team. ? ? ?Thank you for the opportunity to take part in the care of this patient. If you have any further questions, please contact the neurology consultation attending. ? ?Signed, ? ?Donnetta Simpers ?[facility] ?Pager Number [phone] ?_ _ _   _ __   _ __ _ _  __ __   _ __   __ _ ? ?

## 2021-09-21 NOTE — Evaluation (Signed)
Occupational Therapy Evaluation ?Patient Details ?Name: Kyle Arellano ?MRN: [id number] ?DOB: 25-Nov-1955 ?Today's Date: 09/21/2021 ? ? ?History of Present Illness Pt is a 66 y.o. male who presented 09/20/21 with aphasia. CT head was concerning for potential left insular subtle hypodensity. CTA with no LVO, CT perfusion study negative. MRI of brain without contrast negative for acute stroke. Neurology feels that the patient's presentation is consistent with pure word mutism/aphemia and differential includes TIA versus epileptic versus postictal versus speech apraxia. PMH: depression, HLD, schizophrenia  ? ?Clinical Impression ?  ?Patient evaluated by Occupational Therapy with no further acute OT needs identified. All education has been completed and the patient has no further questions. Pt appears to be at, or approaching his baseline and is able to complete ADLs independently.  He scorerd 2/28 on the short blessed Test which is [facility]  See below for any follow-up Occupational Therapy or equipment needs. OT is signing off. Thank you for this referral. ?  ?   ? ?Recommendations for follow up therapy are one component of a multi-disciplinary discharge planning process, led by the attending physician.  Recommendations may be updated based on patient status, additional functional criteria and insurance authorization.  ? ?Follow Up Recommendations ? No OT follow up  ?  ?Assistance Recommended at Discharge None  ?Patient can return home with the following Direct supervision/assist for medications management (initially) ? ?  ?Functional Status Assessment ? Patient has had a recent decline in their functional status and demonstrates the ability to make significant improvements in function in a reasonable and predictable amount of time.  ?Equipment Recommendations ? None recommended by OT  ?  ?Recommendations for Other Services   ? ? ?  ?Precautions / Restrictions Precautions ?Precautions: None ?Restrictions ?Weight Bearing  Restrictions: No  ? ?  ? ?Mobility Bed Mobility ?Overal bed mobility: Independent ?  ?  ?  ?  ?  ?  ?  ?  ? ?Transfers ?Overall transfer level: Independent ?  ?  ?  ?  ?  ?  ?  ?  ?  ?  ? ?  ?Balance Overall balance assessment: No apparent balance deficits (not formally assessed) ?  ?  ?  ?  ?  ?  ?  ?  ?  ?  ?  ?  ?  ?  ?  ?  ?  ?  ?   ? ?ADL either performed or assessed with clinical judgement  ? ?ADL Overall ADL's : Modified independent ?  ?  ?  ?  ?  ?  ?  ?  ?  ?  ?  ?  ?  ?  ?  ?  ?  ?  ?  ?General ADL Comments: has some baseline difficulty with donning socks, but is able to perform mod I  ? ? ? ?Vision Baseline Vision/History: 0 No visual deficits ?Ability to See in Adequate Light: 0 Adequate ?Patient Visual Report: No change from baseline ?Vision Assessment?: Yes ?Eye Alignment: Within Functional Limits ?Ocular Range of Motion: Within Functional Limits ?Alignment/Gaze Preference: Within Defined Limits ?Tracking/Visual Pursuits: Able to track stimulus in all quads without difficulty ?Saccades: Decreased speed of saccadic movement ?Visual Fields: No apparent deficits  ?   ?Perception   ?  ?Praxis   ?  ? ?Pertinent Vitals/Pain Pain Assessment ?Pain Assessment: Faces ?Faces Pain Scale: No hurt  ? ? ? ?Hand Dominance Right ?  ?Extremity/Trunk Assessment Upper Extremity Assessment ?Upper Extremity Assessment: Overall WFL for tasks  assessed;RUE deficits/detail ?RUE Deficits / Details: tremor Rt hand which pt reports is baseline and doesn't interfere with function ?  ?Lower Extremity Assessment ?Lower Extremity Assessment: Overall WFL for tasks assessed ?  ?Cervical / Trunk Assessment ?Cervical / Trunk Assessment: Normal ?  ?Communication Communication ?Communication: Expressive difficulties (questionable) ?  ?Cognition Arousal/Alertness: Awake/alert ?Behavior During Therapy: [facility] for tasks assessed/performed ?Overall Cognitive Status: No family/caregiver present to determine baseline cognitive functioning ?  ?  ?  ?   ?  ?  ?  ?  ?  ?  ?  ?  ?  ?  ?  ?  ?General Comments: Pt scored 2/28 on the Short Blessed Test which is [facility].  Pt is hyperverbose, but is very aware of it.  Anticipate he is close to baseline, but no family present ?  ?  ?General Comments    ? ?  ?Exercises   ?  ?Shoulder Instructions    ? ? ?Home Living Family/patient expects to be discharged to:: Private residence ?Living Arrangements: Alone ?Available Help at Discharge: Family;Available 24 hours/day ?Type of Home: House ?Home Access: Stairs to enter ?Entrance Stairs-Number of Steps: 2 ?Entrance Stairs-Rails: Left;None (L asecending one entry and no rails but a wall on the carport entry) ?Home Layout: One level ?  ?  ?Bathroom Shower/Tub: Tub/shower unit ?  ?Bathroom Toilet: Standard ?  ?  ?Home Equipment: Conservation officer, nature (2 wheels);Cane - single point;Shower seat;Grab bars - toilet;Grab bars - tub/shower;Wheelchair - manual ?  ?  ?  ? ?  ?Prior Functioning/Environment Prior Level of Function : Independent/Modified Independent;Driving ?  ?  ?  ?  ?  ?  ?Mobility Comments: Does not use AD. ?ADLs Comments: Drives. Enjoys working out, going to church and out to eat.  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  ? ?  ?  ?OT Problem List: Decreased cognition ?  ?   ?OT Treatment/Interventions:    ?  ?OT Goals(Current goals can be found in the care plan section) Acute Rehab OT Goals ?Patient Stated Goal: to go home ?OT Goal Formulation: All assessment and education complete, DC therapy  ?OT Frequency:   ?  ? ?Co-evaluation   ?  ?  ?  ?  ? ?  ?AM-PAC OT "6 Clicks" Daily Activity     ?Outcome Measure Help from another person eating meals?: None ?Help from another person taking care of personal grooming?: None ?Help from another person toileting, which includes using toliet, bedpan, or urinal?: None ?Help from another person bathing (including washing, rinsing, drying)?: None ?Help from another person to put on and  taking off regular upper body clothing?: None ?Help from another person to put on and taking off regular lower body clothing?: None ?6 Click Score: 24 ?  ?End of Session Equipment Utilized During Treatment: Other (comment) (Pt hooked up to LTM EEG) ?Nurse Communication: Mobility status ? ?Activity Tolerance: Patient tolerated treatment well ?Patient left: in bed;with call bell/phone within reach;with bed alarm set ? ?OT Visit Diagnosis: Cognitive communication deficit (R41.841)  ?              ?Time: 1478-2956 ?OT Time Calculation (min): 16 min ?Charges:  OT General Charges ?$OT Visit: 1 Visit ?OT Evaluation ?$OT Eval Low Complexity: 1 Low ? ?Nyla Creason C., OTR/L ?Acute Rehabilitation Services ?Pager [phone] ?Office [phone] ? ? ?Lucille Passy M ?09/21/2021, 3:40 PM ?

## 2021-09-21 NOTE — Evaluation (Signed)
Physical Therapy Evaluation & Discharge ?Patient Details ?Name: Kyle Arellano ?MRN: [id number] ?DOB: January 15, 1956 ?Today's Date: 09/21/2021 ? ?History of Present Illness ? Pt is a 66 y.o. male who presented 09/20/21 with aphasia. CT head was concerning for potential left insular subtle hypodensity. CTA with no LVO, CT perfusion study negative. MRI of brain without contrast negative for acute stroke. Neurology feels that the patient's presentation is consistent with pure word mutism/aphemia and differential includes TIA versus epileptic versus postictal versus speech apraxia. PMH: depression, HLD, schizophrenia ?  ?Clinical Impression ? Pt presents with condition above. PTA, he was IND without DME, living alone in a 1-level house with 2 STE. Currently, pt appears to be at his baseline physically, displaying intact, WFL, and fairly symmetrical upper and lower extremity strength, sensation, and coordination bil. Pt able to ambulate without UE support and navigate a flight of stairs with 1 handrails without LOB or assistance. Educated pt to have UE support on wall or rail when navigating stairs as noted increased difficulty with eccentric control when descending, which pt reports is his baseline. Pt repeating self often and intermittently contradicting himself, so question his cognitive status, but pt reports this is his baseline. He reports some issues still with his speech, but overall it has improved per the pt. Requested Speech consult from MD and RN. Pt is at his baseline physically and all education had been completed and questions answered, thus PT will sign off.   ?   ? ?Recommendations for follow up therapy are one component of a multi-disciplinary discharge planning process, led by the attending physician.  Recommendations may be updated based on patient status, additional functional criteria and insurance authorization. ? ?Follow Up Recommendations No PT follow up ? ?  ?Assistance Recommended at Discharge None  (physically)  ?Patient can return home with the following ?   ? ?  ?Equipment Recommendations None recommended by PT  ?Recommendations for Other Services ? Speech consult  ?  ?Functional Status Assessment Patient has not had a recent decline in their functional status  ? ?  ?Precautions / Restrictions Precautions ?Precautions: None ?Restrictions ?Weight Bearing Restrictions: No  ? ?  ? ?Mobility ? Bed Mobility ?Overal bed mobility: Independent ?  ?  ?  ?  ?  ?  ?General bed mobility comments: Pt able to transition supine <> sit EOB without assistance, bed flat. ?  ? ?Transfers ?Overall transfer level: Needs assistance ?Equipment used: None ?Transfers: Sit to/from Stand ?Sit to Stand: Supervision ?  ?  ?  ?  ?  ?General transfer comment: Supervision for safety ?  ? ?Ambulation/Gait ?Ambulation/Gait assistance: Min guard, Supervision ?Gait Distance (Feet): 410 Feet ?Assistive device: None ?Gait Pattern/deviations: Step-through pattern, Decreased stride length ?Gait velocity: slightly reduced self-selected gait speed ?Gait velocity interpretation: 1.31 - 2.62 ft/sec, indicative of limited community ambulator (self-selected speed, but can increase when cued) ?  ?General Gait Details: Pt with slightly reduced self-selected gait speed but able to change head positions and directions without LOB or significant gait changes. Able to change speeds and stop suddenly without LOB, min guard-supervision for safety. ? ?Stairs ?Stairs: Yes ?Stairs assistance: Min guard ?Stair Management: One rail Left, One rail Right, Alternating pattern, Forwards ?Number of Stairs: 10 ?General stair comments: Ascends with L rail and descends with R rail, no LOB. Increased effort with eccentric control when descending noted. ? ?Wheelchair Mobility ?  ? ?Modified Rankin (Stroke Patients Only) ?Modified Rankin (Stroke Patients Only) ?Pre-Morbid Rankin Score: No symptoms ?Modified  Rankin: No symptoms ? ?  ? ?Balance Overall balance assessment: No  apparent balance deficits (not formally assessed) ?  ?  ?  ?  ?  ?  ?  ?  ?  ?  ?  ?  ?  ?  ?  ?  ?  ?  ?   ? ? ? ?Pertinent Vitals/Pain Pain Assessment ?Pain Assessment: Faces ?Faces Pain Scale: No hurt ?Pain Intervention(s): Monitored during session  ? ? ?Home Living Family/patient expects to be discharged to:: Private residence ?Living Arrangements: Alone ?Available Help at Discharge: Family;Available 24 hours/day ?Type of Home: House ?Home Access: Stairs to enter ?Entrance Stairs-Rails: Left;None (L asecending one entry and no rails but a wall on the carport entry) ?Entrance Stairs-Number of Steps: 2 ?  ?Home Layout: One level ?Home Equipment: Conservation officer, nature (2 wheels);Cane - single point;Shower seat;Grab bars - toilet;Grab bars - tub/shower;Wheelchair - manual ?   ?  ?Prior Function Prior Level of Function : Independent/Modified Independent;Driving ?  ?  ?  ?  ?  ?  ?Mobility Comments: Does not use AD. ?ADLs Comments: Drives. ?  ? ? ?Hand Dominance  ?   ? ?  ?Extremity/Trunk Assessment  ? Upper Extremity Assessment ?Upper Extremity Assessment: Defer to OT evaluation (symmetrical and intact sensation, coordination, and strength (grossly 4+ to 5 bil)) ?  ? ?Lower Extremity Assessment ?Lower Extremity Assessment: Overall WFL for tasks assessed (symmetrical and intact sensation, coordination, and strength (grossly 4+ to 5 bil)) ?  ? ?Cervical / Trunk Assessment ?Cervical / Trunk Assessment: Normal  ?Communication  ? Communication: Expressive difficulties (questionable)  ?Cognition Arousal/Alertness: Awake/alert ?Behavior During Therapy: [facility] for tasks assessed/performed ?Overall Cognitive Status: No family/caregiver present to determine baseline cognitive functioning ?  ?  ?  ?  ?  ?  ?  ?  ?  ?  ?  ?  ?  ?  ?  ?  ?General Comments: Pt reporting he feels at his baseline cognitively. However, pt repeating himself often and did contradict himself a few times. Needs redirecting on occasion. ?  ?  ? ?  ?General Comments  General comments (skin integrity, edema, etc.): educated pt to use rail or hold onto wall when navigating stairs ? ?  ?Exercises    ? ?Assessment/Plan  ?  ?PT Assessment Patient does not need any further PT services  ?PT Problem List   ? ?   ?  ?PT Treatment Interventions     ? ?PT Goals (Current goals can be found in the Care Plan section)  ?Acute Rehab PT Goals ?Patient Stated Goal: to go home today ?PT Goal Formulation: All assessment and education complete, DC therapy ?Time For Goal Achievement: 09/22/21 ?Potential to Achieve Goals: Good ? ?  ?Frequency   ?  ? ? ?Co-evaluation   ?  ?  ?  ?  ? ? ?  ?AM-PAC PT "6 Clicks" Mobility  ?Outcome Measure Help needed turning from your back to your side while in a flat bed without using bedrails?: None ?Help needed moving from lying on your back to sitting on the side of a flat bed without using bedrails?: None ?Help needed moving to and from a bed to a chair (including a wheelchair)?: A Little ?Help needed standing up from a chair using your arms (e.g., wheelchair or bedside chair)?: A Little ?Help needed to walk in hospital room?: A Little ?Help needed climbing 3-5 steps with a railing? : A Little ?6 Click Score: 20 ? ?  ?  End of Session   ?Activity Tolerance: Patient tolerated treatment well ?Patient left: in bed;with call bell/phone within reach;with bed alarm set ?Nurse Communication: Mobility status ?PT Visit Diagnosis: Other symptoms and signs involving the nervous system (R29.898) ?  ? ?Time: 2035-5974 ?PT Time Calculation (min) (ACUTE ONLY): 19 min ? ? ?Charges:   PT Evaluation ?$PT Eval Low Complexity: 1 Low ?  ?  ?   ? ? ?Moishe Spice, PT, DPT ?Acute Rehabilitation Services  ?Pager: [phone] ?Office: [phone] ? ? ?Kyle Arellano ?09/21/2021, 8:54 AM ? ?

## 2021-09-21 NOTE — H&P (Signed)
?History and Physical  ? ? ?Kyle Arellano ERX:[id number] DOB: 1956-01-15 DOA: 09/20/2021 ? ?PCP: Claretta Fraise, MD ? ?Patient coming from: [facility] ED ? ?Chief Complaint: Aphasia ? ?HPI: Kyle Arellano is a 66 y.o. male with medical history significant of depression, schizophrenia, hyperlipidemia presented to [place] ED yesterday evening for evaluation of aphasia since 10 AM.  Code stroke activated and tele neurology consulted.  CT head was concerning for potential left insular subtle hypodensity.  CTA with no LVO, CT perfusion study negative.  He was transferred to [facility] ED for MRI.  MRI of brain without contrast negative for acute stroke.  Neurology feels that the patient's presentation is consistent with pure word mutism/aphemia and differential includes TIA versus epileptic versus postictal versus speech apraxia.  Did not feel that this was disorganized speech secondary to schizophrenia.  Recommended admission for MRI of brain with contrast and continuous EEG.  Patient was given IV Keppra 2 g.  Labs showing blood glucose 181 and no other acute abnormalities. ? ?Patient states yesterday at 10 AM he was talking to someone on the phone when all of a sudden he could not speak.  Afterwards when he tried to speak his speech was slurred but has now improved.  Denies history of stroke.  He did not experience any vision changes or focal weakness/numbness.  States he had diarrhea a month ago for about 3 days and was diagnosed with COVID at that time but has now recovered.  Denies fevers, cough, shortness of breath, chest pain, nausea, vomiting, or abdominal pain. ? ?Review of Systems:  ?Review of Systems  ?All other systems reviewed and are negative. ? ?Past Medical History:  ?Diagnosis Date  ? Depression   ? Hyperlipidemia   ? Schizophrenia ([place])   ? ? ?History reviewed. No pertinent surgical history. ? ? reports that he has quit smoking. He has never used smokeless tobacco. He reports that he does not drink  alcohol and does not use drugs. ? ?No Known Allergies ? ?Family History  ?Problem Relation Age of Onset  ? Hypertension Mother   ? Cancer Father   ? Cirrhosis Father   ? Diabetes Father   ? ? ?Prior to Admission medications   ?Medication Sig Start Date End Date Taking? Authorizing Provider  ?benztropine (COGENTIN) 2 MG tablet Take 2 mg by mouth at bedtime.   Yes [provider]  ?cholecalciferol (VITAMIN D) 25 MCG (1000 UNIT) tablet Take 1,000 Units by mouth daily.   Yes [provider]  ?ibuprofen (ADVIL) 200 MG tablet Take 400 mg by mouth every 6 (six) hours as needed for headache.   Yes [provider]  ?loperamide (IMODIUM) 2 MG capsule Take 2 mg by mouth as needed for diarrhea or loose stools.   Yes [provider]  ?Multiple Vitamin (MULTIVITAMIN) tablet Take 1 tablet by mouth daily.   Yes [provider]  ?TRINTELLIX 20 MG TABS tablet Take 20 mg by mouth every morning. 09/08/21  Yes [provider]  ?buPROPion (WELLBUTRIN SR) 150 MG 12 hr tablet Take 150 mg by mouth 2 (two) times daily. ?Patient not taking: Reported on 09/20/2021    [provider]  ?fluPHENAZine (PROLIXIN) 10 MG tablet Take 10 mg by mouth every morning. ?Patient not taking: Reported on 09/20/2021 04/17/19   [provider]  ?rosuvastatin (CRESTOR) 10 MG tablet Take 1 tablet (10 mg total) by mouth daily. (NEEDS TO BE SEEN BEFORE NEXT REFILL) ?Patient not taking: Reported  on 09/20/2021 05/23/21   Claretta Fraise, MD  ? ? ?Physical Exam: ?Vitals:  ? 09/21/21 0100 09/21/21 0130 09/21/21 0145 09/21/21 0300  ?BP: 111/71 (!) 146/100 (!) 133/95 113/70  ?Pulse: (!) 59 82 67 63  ?Resp: 20 (!) '21 20 20  '$ ?Temp:      ?TempSrc:      ?SpO2: 92% 97% 98% 93%  ?Weight:      ? ? ?Physical Exam ?Constitutional:   ?   General: He is not in acute distress. ?HENT:  ?   Head: Normocephalic and atraumatic.  ?Eyes:  ?   Extraocular Movements: Extraocular movements intact.  ?   Conjunctiva/sclera:  Conjunctivae normal.  ?Cardiovascular:  ?   Rate and Rhythm: Normal rate and regular rhythm.  ?   Pulses: Normal pulses.  ?Pulmonary:  ?   Effort: Pulmonary effort is normal. No respiratory distress.  ?   Breath sounds: Normal breath sounds. No wheezing or rales.  ?Abdominal:  ?   General: Bowel sounds are normal. There is no distension.  ?   Palpations: Abdomen is soft.  ?   Tenderness: There is no abdominal tenderness.  ?Musculoskeletal:     ?   General: No swelling or tenderness.  ?   Cervical back: Normal range of motion and neck supple.  ?Skin: ?   General: Skin is warm and dry.  ?Neurological:  ?   General: No focal deficit present.  ?   Mental Status: He is alert and oriented to person, place, and time.  ?   Cranial Nerves: No cranial nerve deficit.  ?   Sensory: No sensory deficit.  ?   Motor: No weakness.  ?  ? ?Labs on Admission: I have personally reviewed following labs and imaging studies ? ?CBC: ?Recent Labs  ?Lab 09/20/21 ?1937  ?WBC 8.0  ?NEUTROABS 6.2  ?HGB 14.9  ?HCT 44.4  ?MCV 88.4  ?PLT 228  ? ?Basic Metabolic Panel: ?Recent Labs  ?Lab 09/20/21 ?1937  ?NA 138  ?K 4.0  ?CL 103  ?CO2 25  ?GLUCOSE 181*  ?BUN 20  ?CREATININE 1.31*  ?CALCIUM 9.3  ? ?GFR: ?Estimated Creatinine Clearance: 61.5 mL/min (A) (by C-G formula based on SCr of 1.31 mg/dL (H)). ?Liver Function Tests: ?Recent Labs  ?Lab 09/20/21 ?1937  ?AST 26  ?ALT 30  ?ALKPHOS 58  ?BILITOT 0.4  ?PROT 6.8  ?ALBUMIN 4.2  ? ?No results for input(s): LIPASE, AMYLASE in the last 168 hours. ?No results for input(s): AMMONIA in the last 168 hours. ?Coagulation Profile: ?Recent Labs  ?Lab 09/20/21 ?1937  ?INR 1.0  ? ?Cardiac Enzymes: ?No results for input(s): CKTOTAL, CKMB, CKMBINDEX, TROPONINI in the last 168 hours. ?BNP (last 3 results) ?No results for input(s): PROBNP in the last 8760 hours. ?HbA1C: ?No results for input(s): HGBA1C in the last 72 hours. ?CBG: ?Recent Labs  ?Lab 09/20/21 ?1936  ?GLUCAP 177*  ? ?Lipid Profile: ?No results for input(s):  CHOL, HDL, LDLCALC, TRIG, CHOLHDL, LDLDIRECT in the last 72 hours. ?Thyroid Function Tests: ?No results for input(s): TSH, T4TOTAL, FREET4, T3FREE, THYROIDAB in the last 72 hours. ?Anemia Panel: ?No results for input(s): VITAMINB12, FOLATE, FERRITIN, TIBC, IRON, RETICCTPCT in the last 72 hours. ?Urine analysis: ?   ?Component Value Date/Time  ? [place] YELLOW 12/07/2012 0902  ? APPEARANCEUR CLEAR 12/07/2012 0902  ? LABSPEC 1.012 12/07/2012 0902  ? PHURINE 7.0 12/07/2012 0902  ? GLUCOSEU NEG 12/07/2012 0902  ? HGBUR NEG 12/07/2012 0902  ? BILIRUBINUR NEG 12/07/2012 0902  ?  KETONESUR NEG 12/07/2012 0902  ? PROTEINUR NEG 12/07/2012 0902  ? UROBILINOGEN 0.2 12/07/2012 0902  ? NITRITE NEG 12/07/2012 0902  ? LEUKOCYTESUR NEG 12/07/2012 0902  ? ? ?Radiological Exams on Admission: I have personally reviewed images ?CT HEAD WO CONTRAST ? ?Result Date: 09/20/2021 ?CLINICAL DATA:  Transient ischemic attack. Speaking difficulty since 10 a.m. this morning. EXAM: CT HEAD WITHOUT CONTRAST TECHNIQUE: Contiguous axial images were obtained from the base of the skull through the vertex without intravenous contrast. RADIATION DOSE REDUCTION: This exam was performed according to the departmental dose-optimization program which includes automated exposure control, adjustment of the mA and/or kV according to patient size and/or use of iterative reconstruction technique. COMPARISON:  None. FINDINGS: Brain: Mild cerebral atrophy. No ventricular dilatation. Subtle asymmetry suggested in the left insular region with suggestion of low attenuation change. This could represent early acute infarct. Consider MRI for further evaluation. No mass effect or midline shift. No abnormal extra-axial fluid collections. Gray-white matter junctions are distinct. Basal cisterns are not effaced. No acute intracranial hemorrhage. Vascular: No hyperdense vessel or unexpected calcification. Skull: Normal. Negative for fracture or focal lesion. Sinuses/Orbits:  Mucosal thickening in the paranasal sinuses. No acute air-fluid levels. Mastoid air cells are clear. Other: None. IMPRESSION: Subtle asymmetrical low-attenuation change in the left insular deep white matter, p

## 2021-09-21 NOTE — Plan of Care (Signed)
POC initiated and progressing. 

## 2021-09-21 NOTE — Progress Notes (Signed)
Brief neurology update ? ?Please see full consult note from Dr. Lorrin Goodell this AM. MRI brain w contrast showed no abnl enhancement. rEEG WNL. Will continue LTM EEG overnight r/o intermittent seizure activity. LTM can be d/c'd in AM if no epileptiform abnl on EEG overnight.  ? ?Kyle Monks, MD ?[facility] ?[phone] ? ?If 7pm- 7am, please page neurology on call as listed in [place]. ? ?

## 2021-09-21 NOTE — Progress Notes (Signed)
Patient arrived to the floor alert and oriented via the stretcher, alert and oriented and ambulated to the floor. Cardiac monitoring initiated, stand up weight obtained and cardiac monitoring initiated. We'll continue to monitor. ?

## 2021-09-21 NOTE — ED Provider Notes (Signed)
Patient sent over from [facility] for MRI and neurology consult.   ? ?Patient seen by Dr. Lorrin Goodell, who requests hospitalist admission for aphasia workup.  MRI negative.  Recommends EEG. ? ?Appreciate Dr. Marlowe Sax for admitting. ?  ?Montine Circle, PA-C ?09/21/21 1700 ? ?  ?Quintella Reichert, MD ?09/21/21 [phone] ? ?

## 2021-09-21 NOTE — Assessment & Plan Note (Addendum)
Currently not on a statin outpatient.  Total cholesterol 173, HDL 34, LDL 103, triglycerides 182.  Restarted Crestor 10 mg p.o. daily.  Aspirin 81 mg p.o. daily. ?

## 2021-09-21 NOTE — Assessment & Plan Note (Addendum)
CT head was concerning for potential left insular subtle hypodensity.  CTA with no LVO, CT perfusion study negative.  MRI of brain without contrast negative for acute stroke.  MR brain with contrast with no enhancement of the small area of nonspecific T2/FLAIR hyperintensity of the anterior left insula, no abnormal intracranial enhancement or dural thickening. Speech currently fluent and does not have any focal neurodeficit.  Neurology feels that the patient's presentation is consistent with pure word mutism/aphemia and differential includes TIA versus epileptic versus postictal versus speech apraxia; and did not feel that this was disorganized speech secondary to schizophrenia.  Patient was given 2 g Keppra IV.  LDL 103.  Patient was monitored on long-term EEG with no seizure or epileptiform discharges appreciated.  Seen by PT and OT with no needs identified.  Patient's symptoms have completely resolved at time of discharge.  No further recommendations per neurology and outpatient follow-up recommended 4 weeks. ? ?

## 2021-09-21 NOTE — Progress Notes (Signed)
OT Cancellation Note ? ?Patient Details ?Name: Kyle Arellano ?MRN: [id number] ?DOB: 01/06/1956 ? ? ?Cancelled Treatment:    Reason Eval/Treat Not Completed: Patient at procedure or test/ unavailable.  Attempted to see pt after PT, but pt being hooked up to LTM EEG.  Will reattempt. ? ?Racheal Mathurin C., OTR/L ?Acute Rehabilitation Services ?Pager [phone] ?Office [phone] ? ? ?Lucille Passy M ?09/21/2021, 12:02 PM ?

## 2021-09-21 NOTE — Progress Notes (Signed)
?PROGRESS NOTE ? ? ? ?Kyle Arellano Wake  ZJI:[id number] DOB: [birth year] DOA: 09/20/2021 ?PCP: Claretta Fraise, MD  ? ? ?Brief Narrative:  ?Kyle Arellano is a 66 year old male with past medical history significant for depression, schizophrenia, hyperlipidemia who presented to [facility] on 09/20/2021 for the evaluation of aphasia since 10 AM in the morning.  Patient reports he was talking to someone on the telephone when all of a sudden he was not able to speak.  Afterwards, when he tried to speak his speech was slurred but since has improved to close to baseline.  Denies any vision changes, no focal weakness/numbness.  Reports diarrhea about a month ago for 3 days following diagnosis with COVID, but now has recovered.  Denies fevers, no cough, no shortness of breath, no chest pain, no nausea/vomiting, no abdominal pain.  No previous history of stroke. ? ?In the ED, temperature 97.9 ?F, HR 96, RR 24, BP 141/99, SPO2 99% on room air.  Sodium 138, potassium 4.0, chloride 103, CO2 25, glucose 21, BUN 20, creatinine 1.31.  WBC 8.0, hemoglobin 14.9, platelets 228.  CT head without contrast with subtle asymmetrical low-attenuation change left insular deep white matter, partially infarct versus artifact; no intracranial hemorrhage or mass effect.  MR brain without contrast negative for acute intracranial normality.  MR brain with contrast with no enhancement of the small area of nonspecific T2/FLAIR hyperintensity of the anterior left insula, no abnormal intracranial enhancement or dural thickening.  CT angiogram head/neck with no large vessel occlusion, no significant atheromatous disease or hemodynamically significant stenosis.  Neurology was consulted and feels patient's presentation consistent with pure word mutism/aphemia; and did not feel this was disorganized speech secondary to schizophrenia.  Recommended admission for EEG.  Patient was given Keppra 2 g IV.  Patient was transferred to [facility].  [facility]  consulted for further evaluation and management.  ? ?  ?Assessment and Plan: ?* Speech problem ?CT head was concerning for potential left insular subtle hypodensity.  CTA with no LVO, CT perfusion study negative.  MRI of brain without contrast negative for acute stroke.  MR brain with contrast with no enhancement of the small area of nonspecific T2/FLAIR hyperintensity of the anterior left insula, no abnormal intracranial enhancement or dural thickening. Speech currently fluent and does not have any focal neurodeficit.  Neurology feels that the patient's presentation is consistent with pure word mutism/aphemia and differential includes TIA versus epileptic versus postictal versus speech apraxia; and did not feel that this was disorganized speech secondary to schizophrenia.  Patient was given 2 g Keppra IV.  LDL 103. ?--Neurology following, appreciate assistance ?--EEG pending ?--PT with no needs identified ?--OT/SLP: Pending ?--Monitor on telemetry ? ? ?HLD (hyperlipidemia) ?Currently not on a statin outpatient.  Total cholesterol 173, HDL 34, LDL 103, triglycerides 182. ? ?Depression, schizophrenia ?--Cogentin 2 mg p.o. nightly and Trintellix 20 mg p.o. daily ? ? ? ? ?DVT prophylaxis: enoxaparin (LOVENOX) injection 40 mg Start: 09/21/21 0800 ? ?  Code Status: Full Code ?Family Communication: No family present at bedside this morning ? ?Disposition Plan:  ?Level of care: Telemetry Medical ?Status is: Observation ?The patient remains OBS appropriate and will d/c before 2 midnights. ?  ? ?Consultants:  ?Neurology ? ?Procedures:  ?EEG: Pending ? ?Antimicrobials:  ?None ? ? ?Subjective: ?Patient seen examined bedside, resting comfortably.  No complaints this morning.  Feels his speech is close to baseline.  Reports no issues with ambulation, no focal weakness.  Awaiting EEG.  Also awaiting further neurology recommendations.  Patient asking when he can discharge home.  No other questions or concerns at this time.  Denies  headache, no dizziness, no chest pain, no visual defect, no fever/chills/night sweats, no nausea/vomiting/diarrhea, no chest pain, no palpitations, no shortness of breath, no abdominal pain, no weakness, no fatigue, no paresthesias.  No acute events overnight per nurse staff. ? ?Objective: ?Vitals:  ? 09/21/21 0400 09/21/21 0542 09/21/21 0604 09/21/21 0748  ?BP: 119/67 124/79 135/81 126/81  ?Pulse: 63 66 65 68  ?Resp: 18 (!) '23 20 17  '$ ?Temp:  98.4 ?F (36.9 ?C) 97.6 ?F (36.4 ?C) (!) 97.5 ?F (36.4 ?C)  ?TempSrc:  Oral Oral Oral  ?SpO2: 95% 96% 96% 95%  ?Weight:   89.1 kg   ?Height:   '5\' 7"'$  (1.702 m)   ? ?No intake or output data in the 24 hours ending 09/21/21 1305 ?Filed Weights  ? 09/20/21 1905 09/21/21 0604  ?Weight: 94.3 kg 89.1 kg  ? ? ?Examination: ? ?Physical Exam: ?GEN: NAD, alert and oriented x 3, wd/wn ?HEENT: NCAT, PERRL, EOMI, sclera clear, MMM ?PULM: CTAB w/o wheezes/crackles, normal respiratory effort, on room air ?CV: RRR w/o M/G/R ?GI: abd soft, NTND, NABS, no R/G/M ?MSK: no peripheral edema, muscle strength globally intact 5/5 bilateral upper/lower extremities ?NEURO: CN II-XII intact, no focal deficits, sensation to light touch intact ?PSYCH: normal mood/affect ?Integumentary: dry/intact, no rashes or wounds ? ? ? ?Data Reviewed: I have personally reviewed following labs and imaging studies ? ?CBC: ?Recent Labs  ?Lab 09/20/21 ?1937  ?WBC 8.0  ?NEUTROABS 6.2  ?HGB 14.9  ?HCT 44.4  ?MCV 88.4  ?PLT 228  ? ?Basic Metabolic Panel: ?Recent Labs  ?Lab 09/20/21 ?1937  ?NA 138  ?K 4.0  ?CL 103  ?CO2 25  ?GLUCOSE 181*  ?BUN 20  ?CREATININE 1.31*  ?CALCIUM 9.3  ? ?GFR: ?Estimated Creatinine Clearance: 59.9 mL/min (A) (by C-G formula based on SCr of 1.31 mg/dL (H)). ?Liver Function Tests: ?Recent Labs  ?Lab 09/20/21 ?1937  ?AST 26  ?ALT 30  ?ALKPHOS 58  ?BILITOT 0.4  ?PROT 6.8  ?ALBUMIN 4.2  ? ?No results for input(s): LIPASE, AMYLASE in the last 168 hours. ?No results for input(s): AMMONIA in the last 168  hours. ?Coagulation Profile: ?Recent Labs  ?Lab 09/20/21 ?1937  ?INR 1.0  ? ?Cardiac Enzymes: ?No results for input(s): CKTOTAL, CKMB, CKMBINDEX, TROPONINI in the last 168 hours. ?BNP (last 3 results) ?No results for input(s): PROBNP in the last 8760 hours. ?HbA1C: ?No results for input(s): HGBA1C in the last 72 hours. ?CBG: ?Recent Labs  ?Lab 09/20/21 ?1936  ?GLUCAP 177*  ? ?Lipid Profile: ?Recent Labs  ?  09/21/21 ?3354  ?CHOL 173  ?HDL 34*  ?LDLCALC 103*  ?TRIG 182*  ?CHOLHDL 5.1  ? ?Thyroid Function Tests: ?No results for input(s): TSH, T4TOTAL, FREET4, T3FREE, THYROIDAB in the last 72 hours. ?Anemia Panel: ?No results for input(s): VITAMINB12, FOLATE, FERRITIN, TIBC, IRON, RETICCTPCT in the last 72 hours. ?Sepsis Labs: ?No results for input(s): PROCALCITON, LATICACIDVEN in the last 168 hours. ? ?No results found for this or any previous visit (from the past 240 hour(s)).  ? ? ? ? ? ?Radiology Studies: ?CT HEAD WO CONTRAST ? ?Result Date: 09/20/2021 ?CLINICAL DATA:  Transient ischemic attack. Speaking difficulty since 10 a.m. this morning. EXAM: CT HEAD WITHOUT CONTRAST TECHNIQUE: Contiguous axial images were obtained from the base of the skull through the vertex without intravenous contrast. RADIATION DOSE REDUCTION: This exam  was performed according to the departmental dose-optimization program which includes automated exposure control, adjustment of the mA and/or kV according to patient size and/or use of iterative reconstruction technique. COMPARISON:  None. FINDINGS: Brain: Mild cerebral atrophy. No ventricular dilatation. Subtle asymmetry suggested in the left insular region with suggestion of low attenuation change. This could represent early acute infarct. Consider MRI for further evaluation. No mass effect or midline shift. No abnormal extra-axial fluid collections. Gray-white matter junctions are distinct. Basal cisterns are not effaced. No acute intracranial hemorrhage. Vascular: No hyperdense vessel or  unexpected calcification. Skull: Normal. Negative for fracture or focal lesion. Sinuses/Orbits: Mucosal thickening in the paranasal sinuses. No acute air-fluid levels. Mastoid air cells are clear. Other:

## 2021-09-21 NOTE — Progress Notes (Signed)
LTM EEG hooked up and running - no initial skin breakdown - push button tested - neuro notified. [facility] monitoring.  

## 2021-09-22 DIAGNOSIS — E7800 Pure hypercholesterolemia, unspecified: Secondary | ICD-10-CM | POA: Diagnosis present

## 2021-09-22 DIAGNOSIS — Z8616 Personal history of COVID-19: Secondary | ICD-10-CM | POA: Diagnosis not present

## 2021-09-22 DIAGNOSIS — Z8249 Family history of ischemic heart disease and other diseases of the circulatory system: Secondary | ICD-10-CM | POA: Diagnosis not present

## 2021-09-22 DIAGNOSIS — Z79899 Other long term (current) drug therapy: Secondary | ICD-10-CM | POA: Diagnosis not present

## 2021-09-22 DIAGNOSIS — R4701 Aphasia: Secondary | ICD-10-CM | POA: Diagnosis present

## 2021-09-22 DIAGNOSIS — Z87891 Personal history of nicotine dependence: Secondary | ICD-10-CM | POA: Diagnosis not present

## 2021-09-22 DIAGNOSIS — F209 Schizophrenia, unspecified: Secondary | ICD-10-CM | POA: Diagnosis present

## 2021-09-22 DIAGNOSIS — R479 Unspecified speech disturbances: Secondary | ICD-10-CM | POA: Diagnosis not present

## 2021-09-22 DIAGNOSIS — F32A Depression, unspecified: Secondary | ICD-10-CM | POA: Diagnosis present

## 2021-09-22 DIAGNOSIS — Z833 Family history of diabetes mellitus: Secondary | ICD-10-CM | POA: Diagnosis not present

## 2021-09-22 MED ORDER — ASPIRIN EC 81 MG PO TBEC: 81.0000 mg | DELAYED_RELEASE_TABLET | Freq: Every day | ORAL | 2 refills | Status: AC

## 2021-09-22 MED ORDER — ROSUVASTATIN CALCIUM 10 MG PO TABS: 10.0000 mg | ORAL_TABLET | Freq: Every day | ORAL | 2 refills | Status: DC

## 2021-09-22 NOTE — Progress Notes (Signed)
Patient was D/C per Dr. Hortense Ramal. Patient no skin breakdown. [facility] was notified.  ?

## 2021-09-22 NOTE — Discharge Summary (Signed)
?Physician Discharge Summary  ?Padraic Marinos Lohmann GBT:[id number] DOB: 1955/10/01 DOA: 09/20/2021 ? ?PCP: Claretta Fraise, MD ? ?Admit date: 09/20/2021 ?Discharge date: 09/22/2021 ? ?Admitted From: Home ?Disposition: Home ? ?Recommendations for Outpatient Follow-up:  ?Follow up with PCP in 1-2 weeks ?Follow-up with neurology in 4 weeks for mutism/aphemia ?Restarted home Crestor 10 mg p.o. daily for elevated LDL ?Started on aspirin. ? ?Home Health: No ?Equipment/Devices: None ? ?Discharge Condition: Stable ?CODE STATUS: Full code ?Diet recommendation: Heart healthy diet ? ?History of present illness: ? ?CADEL STAIRS is a 66 year old male with past medical history significant for depression, schizophrenia, hyperlipidemia who presented to [facility] on 09/20/2021 for the evaluation of aphasia since 10 AM in the morning.  Patient reports he was talking to someone on the telephone when all of a sudden he was not able to speak.  Afterwards, when he tried to speak his speech was slurred but since has improved to close to baseline.  Denies any vision changes, no focal weakness/numbness.  Reports diarrhea about a month ago for 3 days following diagnosis with COVID, but now has recovered.  Denies fevers, no cough, no shortness of breath, no chest pain, no nausea/vomiting, no abdominal pain.  No previous history of stroke. ? ?In the ED, temperature 97.9 ?F, HR 96, RR 24, BP 141/99, SPO2 99% on room air.  Sodium 138, potassium 4.0, chloride 103, CO2 25, glucose 21, BUN 20, creatinine 1.31.  WBC 8.0, hemoglobin 14.9, platelets 228.  CT head without contrast with subtle asymmetrical low-attenuation change left insular deep white matter, partially infarct versus artifact; no intracranial hemorrhage or mass effect.  MR brain without contrast negative for acute intracranial normality.  MR brain with contrast with no enhancement of the small area of nonspecific T2/FLAIR hyperintensity of the anterior left insula, no abnormal  intracranial enhancement or dural thickening.  CT angiogram head/neck with no large vessel occlusion, no significant atheromatous disease or hemodynamically significant stenosis.  Neurology was consulted and feels patient's presentation consistent with pure word mutism/aphemia; and did not feel this was disorganized speech secondary to schizophrenia.  Recommended admission for EEG.  Patient was given Keppra 2 g IV.  Patient was transferred to [facility].  [facility] consulted for further evaluation and management. ? ?Hospital course: ? ?Assessment and Plan: ?* Speech problem ?CT head was concerning for potential left insular subtle hypodensity.  CTA with no LVO, CT perfusion study negative.  MRI of brain without contrast negative for acute stroke.  MR brain with contrast with no enhancement of the small area of nonspecific T2/FLAIR hyperintensity of the anterior left insula, no abnormal intracranial enhancement or dural thickening. Speech currently fluent and does not have any focal neurodeficit.  Neurology feels that the patient's presentation is consistent with pure word mutism/aphemia and differential includes TIA versus epileptic versus postictal versus speech apraxia; and did not feel that this was disorganized speech secondary to schizophrenia.  Patient was given 2 g Keppra IV.  LDL 103.  Patient was monitored on long-term EEG with no seizure or epileptiform discharges appreciated.  Seen by PT and OT with no needs identified.  Patient's symptoms have completely resolved at time of discharge.  No further recommendations per neurology and outpatient follow-up recommended 4 weeks. ? ? ?HLD (hyperlipidemia) ?Currently not on a statin outpatient.  Total cholesterol 173, HDL 34, LDL 103, triglycerides 182.  Restarted Crestor 10 mg p.o. daily.  Aspirin 81 mg p.o. daily. ? ?Depression, schizophrenia ?Cogentin 2 mg p.o. nightly  and Trintellix 20 mg p.o. daily ? ? ? ? ? ? ?Discharge Diagnoses:  ?Principal Problem: ?   Speech problem ?Active Problems: ?  Depression, schizophrenia ?  HLD (hyperlipidemia) ?  Aphasia ? ? ? ?Discharge Instructions ? ?Discharge Instructions   ? ? Ambulatory referral to Neurology   Complete by: As directed ?  ? An appointment is requested in approximately: 4 weeks  ? Call MD for:  difficulty breathing, headache or visual disturbances   Complete by: As directed ?  ? Call MD for:  extreme fatigue   Complete by: As directed ?  ? Call MD for:  persistant dizziness or light-headedness   Complete by: As directed ?  ? Call MD for:  persistant nausea and vomiting   Complete by: As directed ?  ? Call MD for:  severe uncontrolled pain   Complete by: As directed ?  ? Call MD for:  temperature >100.4   Complete by: As directed ?  ? Diet - low sodium heart healthy   Complete by: As directed ?  ? Increase activity slowly   Complete by: As directed ?  ? ?  ? ?Allergies as of 09/22/2021   ?No Known Allergies ?  ? ?  ?Medication List  ?  ? ?STOP taking these medications   ? ?buPROPion 150 MG 12 hr tablet ?Commonly known as: WELLBUTRIN SR ?  ?fluPHENAZine 10 MG tablet ?Commonly known as: PROLIXIN ?  ? ?  ? ?TAKE these medications   ? ?aspirin EC 81 MG tablet ?Take 1 tablet (81 mg total) by mouth daily. Swallow whole. ?  ?benztropine 2 MG tablet ?Commonly known as: COGENTIN ?Take 2 mg by mouth at bedtime. ?  ?cholecalciferol 25 MCG (1000 UNIT) tablet ?Commonly known as: VITAMIN D ?Take 1,000 Units by mouth daily. ?  ?ibuprofen 200 MG tablet ?Commonly known as: ADVIL ?Take 400 mg by mouth every 6 (six) hours as needed for headache. ?  ?loperamide 2 MG capsule ?Commonly known as: IMODIUM ?Take 2 mg by mouth as needed for diarrhea or loose stools. ?  ?multivitamin tablet ?Take 1 tablet by mouth daily. ?  ?rosuvastatin 10 MG tablet ?Commonly known as: CRESTOR ?Take 1 tablet (10 mg total) by mouth daily. (NEEDS TO BE SEEN BEFORE NEXT REFILL) ?  ?Trintellix 20 MG Tabs tablet ?Generic drug: vortioxetine HBr ?Take 20 mg by mouth  every morning. ?  ? ?  ? ? Follow-up Information   ? ? Claretta Fraise, MD. Schedule an appointment as soon as possible for a visit in 1 week(s).   ?Specialty: Family Medicine ?Contact information: ?[address] ?[place] [address] ?[phone] ? ? ?  ?  ? ? [facility]. Schedule an appointment as soon as possible for a visit in 4 week(s).   ?Specialty: Neurology ?Contact information: ?[place] ?[phone] ? ?  ?  ? ?  ?  ? ?  ? ?No Known Allergies ? ?Consultations: ?Neurology ? ? ?Procedures/Studies: ?DG Abd 1 View ? ?Result Date: 08/25/2021 ?CLINICAL DATA:  Abdominal pain. EXAM: ABDOMEN - 1 VIEW COMPARISON:  None. FINDINGS: Air is seen within nondistended loops of small and large bowel. No portal venous gas or pneumatosis is seen. No gross free air. The superior aspect of the liver is not imaged. 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 IMPRESSION: Nonobstructed bowel-gas pattern. Electronically Signed   By: Yvonne Kendall M.D.   On: 08/25/2021 18:30  ? ?CT HEAD  WO CONTRAST ? ?Result Date: 09/20/2021 ?CLINICAL DATA:  Transient ischemic attack. Speaking difficulty since 10 a.m. this morning. EXAM: CT HEAD WITHOUT CONTRAST TECHNIQUE: Contiguous axial images were obtained from the base of the skull through the vertex without intravenous contrast. RADIATION DOSE REDUCTION: This exam was performed according to the departmental dose-optimization program which includes automated exposure control, adjustment of the mA and/or kV according to patient size and/or use of iterative reconstruction technique. COMPARISON:  None. FINDINGS: Brain: Mild cerebral atrophy. No ventricular dilatation. Subtle asymmetry suggested in the left insular region with suggestion of low attenuation change. This could represent early acute infarct. Consider MRI for further evaluation. No mass effect or midline shift. No abnormal extra-axial fluid collections.  Gray-white matter junctions are distinct. Basal cisterns are not effaced. No acute intracranial hemorrhage. Vascular: No hyperdense vessel or unexpected calcification. Skull: Normal. Negative for fracture or f

## 2021-09-22 NOTE — Progress Notes (Signed)
Pt being d/c, VSS, Tele removed and returned, Education complete, IV removed.  ? ?Alvis Lemmings, RN ?09/22/2021 ?11:34 AM  ?

## 2021-09-22 NOTE — Procedures (Addendum)
Patient Name: Kyle Arellano  ?MRN: [id number]  ?Epilepsy Attending: Lora Havens  ?Referring Physician/Provider: Donnetta Simpers, MD ?Duration: 09/21/2021 5883 to 09/22/2021 2549 ? 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  EEG to evaluate for seizure. ? ?Level of alertness: Awake, asleep ? ?AEDs during EEG study: None ? ?Technical aspects: This EEG study was done with scalp electrodes positioned according to the 10-20 International system of electrode placement. Electrical activity was acquired at a sampling rate of '500Hz'$  and reviewed with a high frequency filter of '70Hz'$  and a low frequency filter of '1Hz'$ . EEG data were recorded continuously and digitally stored.  ? ?Description: The posterior dominant rhythm consists of 8-9 Hz activity of moderate voltage (25-35 uV) seen predominantly in posterior head regions, symmetric and reactive to eye opening and eye closing. Sleep was characterized by vertex waves, sleep spindles (12 to 14 Hz), maximal frontocentral region.  Hyperventilation and photic stimulation were not performed.    ? ?IMPRESSION: ?This study is within normal limits. No seizures or epileptiform discharges were seen throughout the recording. ? ?Lora Havens  ? ?

## 2021-09-22 NOTE — Progress Notes (Addendum)
Neurology Progress Note ?[place] ?MR# [id number] ?09/22/2021 ? ? ?S: no overnight events; no new complaints. ? ? ?O: ?Current vital signs: ?BP (!) 132/91 (BP Location: Left Arm)   Pulse 80   Temp 97.9 ?F (36.6 ?C) (Oral)   Resp 20   Ht '5\' 7"'$  (1.702 m)   Wt 89.1 kg   SpO2 96%   BMI 30.76 kg/m?  ?Vital signs in last 24 hours: ?Temp:  [97.7 ?F (36.5 ?C)-98.3 ?F (36.8 ?C)] 97.9 ?F (36.6 ?C) (04/03 0750) ?Pulse Rate:  [76-86] 80 (04/03 0750) ?Resp:  [20-21] 20 (04/03 0750) ?BP: (132-136)/(82-97) 132/91 (04/03 0750) ?SpO2:  [95 %-96 %] 96 % (04/03 0750) ?GENERAL: Awake, alert in NAD ?HEENT: Normocephalic and atraumatic, moist mm, no LN++, no thyromegaly ?LUNGS: symmetric excursions bilaterally with no audible wheezes. ?CV: RR, equal pulses bilaterally. ?ABDOMEN: Soft, nontender, nondistended with normoactive BS ?Ext: warm, well perfused, intact peripheral pulses  ?NEURO:  ?Mental Status: AA&Ox3  ?Language: speech is non fluent with pt stuck on a few words.  Intact naming, impaired repetition, and intact comprehension. ?PERR. EOMI, visual fields full, no facial asymmetry, facial sensation intact, hearing intact. ?No evidence of tongue atrophy or fibrillations, tongue/uvula/soft palate midline elevates symmetrically  ?Normal sternocleidomastoid and trapezius muscle strength. Motor: 5/5 strength in all extremities. ?Tone: Tone and bulk is normal ?Sensation: Intact to light touch bilaterally ?Coordination: FTN intact bilaterally, no ataxia in BLE. ?Gait - Deferred ? ?Labs ? ?   ?Component Value Date/Time  ? WBC 8.0 09/20/2021 1937  ? RBC 5.02 09/20/2021 1937  ? HGB 14.9 09/20/2021 1937  ? HGB 17.2 09/02/2020 1410  ? HCT 44.4 09/20/2021 1937  ? HCT 51.6 (H) 09/02/2020 1410  ? PLT 228 09/20/2021 1937  ? PLT 283 09/02/2020 1410  ? MCV 88.4 09/20/2021 1937  ? MCV 89 09/02/2020 1410  ? MCH 29.7 09/20/2021 1937  ? MCHC 33.6 09/20/2021 1937  ? RDW 14.1 09/20/2021 1937  ? RDW 13.5 09/02/2020 1410  ? LYMPHSABS 0.9  09/20/2021 1937  ? LYMPHSABS 1.7 09/02/2020 1410  ? MONOABS 0.6 09/20/2021 1937  ? EOSABS 0.1 09/20/2021 1937  ? EOSABS 0.2 09/02/2020 1410  ? BASOSABS 0.1 09/20/2021 1937  ? BASOSABS 0.2 09/02/2020 1410  ? ? ?   ?Component Value Date/Time  ? NA 138 09/20/2021 1937  ? NA 140 09/02/2020 1410  ? K 4.0 09/20/2021 1937  ? CL 103 09/20/2021 1937  ? CO2 25 09/20/2021 1937  ? GLUCOSE 181 (H) 09/20/2021 1937  ? BUN 20 09/20/2021 1937  ? BUN 25 09/02/2020 1410  ? CREATININE 1.31 (H) 09/20/2021 1937  ? CREATININE 1.36 (H) 12/07/2012 0902  ? CALCIUM 9.3 09/20/2021 1937  ? PROT 6.8 09/20/2021 1937  ? PROT 6.7 09/02/2020 1410  ? ALBUMIN 4.2 09/20/2021 1937  ? ALBUMIN 4.7 09/02/2020 1410  ? AST 26 09/20/2021 1937  ? ALT 30 09/20/2021 1937  ? ALKPHOS 58 09/20/2021 1937  ? BILITOT 0.4 09/20/2021 1937  ? BILITOT 0.5 09/02/2020 1410  ? GFRNONAA >60 09/20/2021 1937  ? GFRAA 72 05/04/2019 1340  ? ? ?   ?Component Value Date/Time  ? CHOL 173 09/21/2021 0546  ? CHOL 221 (H) 09/02/2020 1410  ? TRIG 182 (H) 09/21/2021 0546  ? HDL 34 (L) 09/21/2021 0546  ? HDL 40 09/02/2020 1410  ? CHOLHDL 5.1 09/21/2021 0546  ? VLDL 36 09/21/2021 0546  ? [place] 103 (H) 09/21/2021 0546  ? LDLCALC 138 (H) 09/02/2020 1410  ? LDLDIRECT 130 (  H) 09/02/2020 1410  ? ? ?Medications: ?Scheduled Meds: ? benztropine  2 mg Oral QHS  ? enoxaparin (LOVENOX) injection  40 mg Subcutaneous Q24H  ? vortioxetine HBr  20 mg Oral q morning  ? ?Continuous Infusions: ?PRN Meds:.acetaminophen **OR** acetaminophen ? ?Imaging ?I have reviewed images in epic and the results pertinent to this consultation are: ?09/20/21 CT angio Head and Neck with contrast showed No LVO ?09/20/21 CT Head showed - No acute intracranial hemorrhage or mass effect.Subtle asymmetrical low-attenuation change in the left insular deep white matter, possibly artifact or early infarct. ?09/20/21 MRI Brain Normal brain MRI for age.  No acute intracranial abnormality. ?09/21/21 MRI Brain No enhancement of the small area  of nonspecific T2/FLAIR ?hyperintensity at the anterior left insula. No abnormal intracranial enhancement or dural thickening. ?09/21/21 rEEG WNL ?09/21/21-09/22/21 LTM EEG -study is within normal limits. No seizures or epileptiform discharges were seen throughout the recording. ? ? ?Assessment: Kyle Arellano is a 66 y.o. male PMHx significant for depression, HLD, schizophrenia who presents with aphasia. He slept very little last night. He woke up at 0700 and was fine. He was talking on the phone around 1000 on 09/20/21 when he had sudden onset difficulty finding words. Speech was garbled and this had been going on for most of the day. Improved a little but then got worse. At time of initial Neurology consult, it seemed overall that this speech was getting better but still had mild to moderate aphasia. ? ?Initially presented to [place] where [facility] concerning for potential L insular subtle hypodensity. CTA with no LVO. He was seen by Teleneurology and transferred to [facility]. MRI Brain without contrast with no acute stroke. ? ?09/22/21- Today patient is alert and oriented x 3. At or close to baseline. No seizures identified and will sign off at this time.  ? ?Impression: Pure word mutism/aphemia. Started abruptly Saturday AM but has resolved. Differential includes epileptic or post ictal vs speech apraxia. Unlikely for primary progressive aphasia to present so rapidly, do not think this is disorganized speech 2/2 schizophrenia. ? ?Recommendations: ?- LTM can be d/c'd  ?- Neurohospitalist service will sign off.  ?- Patient can follow up outpatient with neurology ? ? ?Electronically signed by:  ?Parke Poisson, Neurology NP ?Can be reached on Epic Secure Messenger ?09/22/2021, 10:25 AM ? ?Electronically signed: Dr. Kerney Elbe ? ?

## 2021-09-23 ENCOUNTER — Telehealth: Payer: Self-pay

## 2021-09-23 NOTE — Telephone Encounter (Addendum)
Transition Care Management Unsuccessful Follow-up Telephone Call ? ?Date of discharge and from where:  [place] 09-22-21 Dx: Aphasia ? ?Attempts:  1st Attempt ? ?Reason for unsuccessful TCM follow-up call:  Unable to reach patient ? ?Transition Care Management Follow-up Telephone Call ?Date of discharge and from where: [place] 09-22-21 Dx: Aphasia ?How have you been since you were released from the hospital? Doing ok ?Any questions or concerns? No ? ?Items Reviewed: ?Did the pt receive and understand the discharge instructions provided? Yes  ?Medications obtained and verified? Yes  ?Other? No  ?Any new allergies since your discharge? No  ?Dietary orders reviewed? Yes ?Do you have support at home? Yes  ? ?Home Care and Equipment/Supplies: ?Were home health services ordered? no ?If so, what is the name of the agency? na  ?Has the agency set up a time to come to the patient's home? not applicable ?Were any new equipment or medical supplies ordered?  No ?What is the name of the medical supply agency? na ?Were you able to get the supplies/equipment? not applicable ?Do you have any questions related to the use of the equipment or supplies? No ? ?Functional Questionnaire: (I = Independent and D = Dependent) ?ADLs: I ? ?Bathing/Dressing- I ? ?Meal Prep- I ? ?Eating- I ? ?Maintaining continence- I ? ?Transferring/Ambulation- I ? ?Managing Meds- I ? ?Follow up appointments reviewed: ? ?PCP Hospital f/u appt confirmed? Yes  Scheduled to see Dr Livia Snellen on 09-24-21 @ 325pm. ?[facility] f/u appt confirmed? No  pt will call neurologist to schedule fu appt . ?Are transportation arrangements needed? No  ?If their condition worsens, is the pt aware to call PCP or go to the Emergency Dept.? Yes ?Was the patient provided with contact information for the PCP's office or ED? Yes ?Was to pt encouraged to call back with questions or concerns? Yes  ? ?  ?

## 2021-09-24 ENCOUNTER — Encounter: Payer: Self-pay | Admitting: Family Medicine

## 2021-09-24 ENCOUNTER — Ambulatory Visit (INDEPENDENT_AMBULATORY_CARE_PROVIDER_SITE_OTHER): Payer: Medicare Other | Admitting: Family Medicine

## 2021-09-24 VITALS — BP 129/78 | HR 83 | Temp 97.2°F | Ht 67.0 in | Wt 200.4 lb

## 2021-09-24 DIAGNOSIS — F331 Major depressive disorder, recurrent, moderate: Secondary | ICD-10-CM | POA: Diagnosis not present

## 2021-09-24 DIAGNOSIS — R4701 Aphasia: Secondary | ICD-10-CM | POA: Diagnosis not present

## 2021-09-24 MED ORDER — TRAZODONE HCL 100 MG PO TABS: 100.0000 mg | ORAL_TABLET | Freq: Every day | ORAL | 2 refills | Status: DC

## 2021-09-24 NOTE — Progress Notes (Signed)
? ?Subjective:  ?Patient ID: Kyle Arellano, male    DOB: 1956/01/08  Age: 66 y.o. MRN: [id number] ? ?CC: Transitions Of Care ? ? ?HPI ?Kyle Arellano presents for aphasia onset 4/1. Admitted. CT had evidence for insular stroke. MRI w & w/o contrast negative for CVA. Had EEG that was negative for Seizure.  ? ?Aphasia lasted 24 hours. Speech was fluent prior to DC.  DCed on 09/22/21. Not quite up to par yet. But not bad.  ? ?TAking schizoaffective med called flupine, a substitute for fluphenazine.) Sees psychiatry.  ? ?Not sleeping well for a long time. Would like something to help.  ? ? ?  09/24/2021  ?  3:33 PM 09/24/2021  ?  3:28 PM 09/02/2020  ?  1:15 PM  ?Depression screen PHQ 2/9  ?Decreased Interest 0 0 0  ?Down, Depressed, Hopeless 1 0 1  ?PHQ - 2 Score 1 0 1  ?Altered sleeping 3  0  ?Tired, decreased energy 3  0  ?Change in appetite 0  0  ?Feeling bad or failure about yourself  0  0  ?Trouble concentrating 0  2  ?Moving slowly or fidgety/restless 0  0  ?Suicidal thoughts 0  0  ?PHQ-9 Score 7  3  ?Difficult doing work/chores Not difficult at all  Not difficult at all  ? ? ?History ?Kyle Arellano has a past medical history of Depression, Hyperlipidemia, and Schizophrenia ([place]).  ? ?He has no past surgical history on file.  ? ?His family history includes Cancer in his father; Cirrhosis in his father; Diabetes in his father; Hypertension in his mother.He reports that he has quit smoking. He has never used smokeless tobacco. He reports that he does not drink alcohol and does not use drugs. ? ? ? ?ROS ?Review of Systems  ?Constitutional: Negative.   ?HENT: Negative.    ?Eyes:  Negative for visual disturbance.  ?Respiratory:  Negative for cough and shortness of breath.   ?Cardiovascular:  Negative for chest pain and leg swelling.  ?Gastrointestinal:  Negative for abdominal pain, diarrhea, nausea and vomiting.  ?Genitourinary:  Negative for difficulty urinating.  ?Musculoskeletal:  Negative for arthralgias and myalgias.  ?Skin:   Negative for rash.  ?Neurological:  Positive for speech difficulty. Negative for dizziness, tremors, seizures, syncope, facial asymmetry, light-headedness, numbness and headaches.  ?Psychiatric/Behavioral:  Negative for sleep disturbance.   ? ?Objective:  ?BP 129/78   Pulse 83   Temp (!) 97.2 ?F (36.2 ?C)   Ht $R'5\' 7"'su$  (1.702 m)   Wt 200 lb 6.4 oz (90.9 kg)   SpO2 97%   BMI 31.39 kg/m?  ? ?BP Readings from Last 3 Encounters:  ?09/24/21 129/78  ?09/22/21 (!) 132/91  ?08/25/21 128/85  ? ? ?Wt Readings from Last 3 Encounters:  ?09/24/21 200 lb 6.4 oz (90.9 kg)  ?09/21/21 196 lb 6.4 oz (89.1 kg)  ?08/25/21 208 lb (94.3 kg)  ? ? ? ?Physical Exam ?Vitals reviewed.  ?Constitutional:   ?   Appearance: He is well-developed.  ?HENT:  ?   Head: Normocephalic and atraumatic.  ?   Right Ear: External ear normal.  ?   Left Ear: External ear normal.  ?   Mouth/Throat:  ?   Pharynx: No oropharyngeal exudate or posterior oropharyngeal erythema.  ?Eyes:  ?   Pupils: Pupils are equal, round, and reactive to light.  ?Cardiovascular:  ?   Rate and Rhythm: Normal rate and regular rhythm.  ?   Heart sounds: No murmur heard. ?  Pulmonary:  ?   Effort: No respiratory distress.  ?   Breath sounds: Normal breath sounds.  ?Musculoskeletal:  ?   Cervical back: Normal range of motion and neck supple.  ?Neurological:  ?   Mental Status: He is alert and oriented to person, place, and time.  ?   GCS: GCS eye subscore is 4. GCS verbal subscore is 5. GCS motor subscore is 6.  ?   Cranial Nerves: Cranial nerves 2-12 are intact. No dysarthria or facial asymmetry.  ?   Motor: Motor function is intact.  ?   Coordination: Coordination is intact.  ?   Gait: Gait is intact.  ? ? ? ? ?Assessment & Plan:  ? ?Beulah was seen today for transitions of care. ? ?Diagnoses and all orders for this visit: ? ?Aphasia ?-     CBC with Differential/Platelet ?-     CMP14+EGFR ? ?Moderate episode of recurrent major depressive disorder ([place]) ? ?Other orders ?-      traZODone (DESYREL) 100 MG tablet; Take 1 tablet (100 mg total) by mouth at bedtime. for sleep. ? ? ? ? ? ? ?I am having Kyle Arellano start on traZODone. I am also having him maintain his benztropine, Trintellix, loperamide, multivitamin, cholecalciferol, ibuprofen, rosuvastatin, and aspirin EC. ? ?Allergies as of 09/24/2021   ?No Known Allergies ?  ? ?  ?Medication List  ?  ? ?  ? Accurate as of September 24, 2021 11:59 PM. If you have any questions, ask your nurse or doctor.  ?  ?  ? ?  ? ?aspirin EC 81 MG tablet ?Take 1 tablet (81 mg total) by mouth daily. Swallow whole. ?  ?benztropine 2 MG tablet ?Commonly known as: COGENTIN ?Take 2 mg by mouth at bedtime. ?  ?cholecalciferol 25 MCG (1000 UNIT) tablet ?Commonly known as: VITAMIN D ?Take 1,000 Units by mouth daily. ?  ?ibuprofen 200 MG tablet ?Commonly known as: ADVIL ?Take 400 mg by mouth every 6 (six) hours as needed for headache. ?  ?loperamide 2 MG capsule ?Commonly known as: IMODIUM ?Take 2 mg by mouth as needed for diarrhea or loose stools. ?  ?multivitamin tablet ?Take 1 tablet by mouth daily. ?  ?rosuvastatin 10 MG tablet ?Commonly known as: CRESTOR ?Take 1 tablet (10 mg total) by mouth daily. (NEEDS TO BE SEEN BEFORE NEXT REFILL) ?  ?traZODone 100 MG tablet ?Commonly known as: DESYREL ?Take 1 tablet (100 mg total) by mouth at bedtime. for sleep. ?Started by: Claretta Fraise, MD ?  ?Trintellix 20 MG Tabs tablet ?Generic drug: vortioxetine HBr ?Take 20 mg by mouth every morning. ?  ? ?  ? ? ? ?Follow-up: No follow-ups on file. ? ?Claretta Fraise, M.D. ?

## 2021-09-25 LAB — CBC WITH DIFFERENTIAL/PLATELET
Basophils Absolute: 0.1 10*3/uL (ref 0.0–0.2)
Basos: 2 %
EOS (ABSOLUTE): 0.1 10*3/uL (ref 0.0–0.4)
Eos: 2 %
Hematocrit: 45.5 % (ref 37.5–51.0)
Hemoglobin: 15.4 g/dL (ref 13.0–17.7)
Immature Grans (Abs): 0 10*3/uL (ref 0.0–0.1)
Immature Granulocytes: 0 %
Lymphocytes Absolute: 1.2 10*3/uL (ref 0.7–3.1)
Lymphs: 17 %
MCH: 29.8 pg (ref 26.6–33.0)
MCHC: 33.8 g/dL (ref 31.5–35.7)
MCV: 88 fL (ref 79–97)
Monocytes Absolute: 0.6 10*3/uL (ref 0.1–0.9)
Monocytes: 9 %
Neutrophils Absolute: 4.8 10*3/uL (ref 1.4–7.0)
Neutrophils: 70 %
Platelets: 230 10*3/uL (ref 150–450)
RBC: 5.16 x10E6/uL (ref 4.14–5.80)
RDW: 13.5 % (ref 11.6–15.4)
WBC: 6.8 10*3/uL (ref 3.4–10.8)

## 2021-09-25 LAB — CMP14+EGFR
ALT: 24 IU/L (ref 0–44)
AST: 22 IU/L (ref 0–40)
Albumin/Globulin Ratio: 2.4 — ABNORMAL HIGH (ref 1.2–2.2)
Albumin: 4.3 g/dL (ref 3.8–4.8)
Alkaline Phosphatase: 72 IU/L (ref 44–121)
BUN/Creatinine Ratio: 15 (ref 10–24)
BUN: 17 mg/dL (ref 8–27)
Bilirubin Total: 0.3 mg/dL (ref 0.0–1.2)
CO2: 22 mmol/L (ref 20–29)
Calcium: 9.3 mg/dL (ref 8.6–10.2)
Chloride: 107 mmol/L — ABNORMAL HIGH (ref 96–106)
Creatinine, Ser: 1.17 mg/dL (ref 0.76–1.27)
Globulin, Total: 1.8 g/dL (ref 1.5–4.5)
Glucose: 103 mg/dL — ABNORMAL HIGH (ref 70–99)
Potassium: 4.3 mmol/L (ref 3.5–5.2)
Sodium: 145 mmol/L — ABNORMAL HIGH (ref 134–144)
Total Protein: 6.1 g/dL (ref 6.0–8.5)
eGFR: 69 mL/min/{1.73_m2} (ref >59)

## 2021-09-26 NOTE — Progress Notes (Signed)
Hello Kyle Arellano,  Your lab result is normal and/or stable.Some minor variations that are not significant are commonly marked abnormal, but do not represent any medical problem for you.  Best regards, Jantz Main, M.D.

## 2021-09-27 ENCOUNTER — Encounter: Payer: Self-pay | Admitting: Family Medicine

## 2021-10-11 ENCOUNTER — Encounter (HOSPITAL_COMMUNITY): Payer: Self-pay

## 2021-10-11 ENCOUNTER — Other Ambulatory Visit: Payer: Self-pay

## 2021-10-11 ENCOUNTER — Emergency Department (HOSPITAL_COMMUNITY)
Admission: EM | Admit: 2021-10-11 | Discharge: 2021-10-14 | Disposition: A | Discharge status: Other Acute Inpatient Hospital | Payer: Medicare Other | Attending: Emergency Medicine | Admitting: Emergency Medicine

## 2021-10-11 DIAGNOSIS — Z20822 Contact with and (suspected) exposure to covid-19: Secondary | ICD-10-CM | POA: Diagnosis not present

## 2021-10-11 DIAGNOSIS — Y900 Blood alcohol level of less than 20 mg/100 ml: Secondary | ICD-10-CM | POA: Insufficient documentation

## 2021-10-11 DIAGNOSIS — F23 Brief psychotic disorder: Secondary | ICD-10-CM | POA: Diagnosis not present

## 2021-10-11 DIAGNOSIS — Z79899 Other long term (current) drug therapy: Secondary | ICD-10-CM | POA: Insufficient documentation

## 2021-10-11 DIAGNOSIS — F22 Delusional disorders: Secondary | ICD-10-CM | POA: Diagnosis not present

## 2021-10-11 DIAGNOSIS — I639 Cerebral infarction, unspecified: Secondary | ICD-10-CM | POA: Diagnosis not present

## 2021-10-11 DIAGNOSIS — R9431 Abnormal electrocardiogram [ECG] [EKG]: Secondary | ICD-10-CM | POA: Diagnosis not present

## 2021-10-11 DIAGNOSIS — Z7982 Long term (current) use of aspirin: Secondary | ICD-10-CM | POA: Insufficient documentation

## 2021-10-11 DIAGNOSIS — F29 Unspecified psychosis not due to a substance or known physiological condition: Secondary | ICD-10-CM | POA: Diagnosis present

## 2021-10-11 LAB — COMPREHENSIVE METABOLIC PANEL
ALT: 24 U/L (ref 0–44)
AST: 26 U/L (ref 15–41)
Albumin: 4.1 g/dL (ref 3.5–5.0)
Alkaline Phosphatase: 57 U/L (ref 38–126)
Anion gap: 7 (ref 5–15)
BUN: 26 mg/dL — ABNORMAL HIGH (ref 8–23)
CO2: 28 mmol/L (ref 22–32)
Calcium: 9.2 mg/dL (ref 8.9–10.3)
Chloride: 107 mmol/L (ref 98–111)
Creatinine, Ser: 1.09 mg/dL (ref 0.61–1.24)
GFR, Estimated: >60 mL/min (ref >60)
Glucose, Bld: 91 mg/dL (ref 70–99)
Potassium: 3.7 mmol/L (ref 3.5–5.1)
Sodium: 142 mmol/L (ref 135–145)
Total Bilirubin: 0.6 mg/dL (ref 0.3–1.2)
Total Protein: 7.1 g/dL (ref 6.5–8.1)

## 2021-10-11 LAB — CBC WITH DIFFERENTIAL/PLATELET
Abs Immature Granulocytes: 0.03 10*3/uL (ref 0.00–0.07)
Basophils Absolute: 0.1 10*3/uL (ref 0.0–0.1)
Basophils Relative: 2 %
Eosinophils Absolute: 0.2 10*3/uL (ref 0.0–0.5)
Eosinophils Relative: 3 %
HCT: 46.2 % (ref 39.0–52.0)
Hemoglobin: 15 g/dL (ref 13.0–17.0)
Immature Granulocytes: 1 %
Lymphocytes Relative: 16 %
Lymphs Abs: 0.9 10*3/uL (ref 0.7–4.0)
MCH: 29.9 pg (ref 26.0–34.0)
MCHC: 32.5 g/dL (ref 30.0–36.0)
MCV: 92 fL (ref 80.0–100.0)
Monocytes Absolute: 0.5 10*3/uL (ref 0.1–1.0)
Monocytes Relative: 8 %
Neutro Abs: 3.9 10*3/uL (ref 1.7–7.7)
Neutrophils Relative %: 70 %
Platelets: 265 10*3/uL (ref 150–400)
RBC: 5.02 MIL/uL (ref 4.22–5.81)
RDW: 14.4 % (ref 11.5–15.5)
WBC: 5.5 10*3/uL (ref 4.0–10.5)
nRBC: 0 % (ref 0.0–0.2)

## 2021-10-11 LAB — RAPID URINE DRUG SCREEN, HOSP PERFORMED
Amphetamines: NOT DETECTED
Barbiturates: NOT DETECTED
Benzodiazepines: NOT DETECTED
Cocaine: NOT DETECTED
Opiates: NOT DETECTED
Tetrahydrocannabinol: NOT DETECTED

## 2021-10-11 LAB — ETHANOL: Alcohol, Ethyl (B): <10 mg/dL (ref <10)

## 2021-10-11 MED ORDER — VORTIOXETINE HBR 20 MG PO TABS
20.0000 mg | ORAL_TABLET | Freq: Every morning | ORAL | Status: DC
  Administered 2021-10-12 – 2021-10-13 (×2): 20 mg via ORAL
  Filled 2021-10-11 (×6): qty 1

## 2021-10-11 MED ORDER — VITAMIN D 25 MCG (1000 UNIT) PO TABS
1000.0000 [IU] | ORAL_TABLET | Freq: Every day | ORAL | Status: DC
  Administered 2021-10-12 – 2021-10-13 (×2): 1000 [IU] via ORAL
  Filled 2021-10-11 (×2): qty 1

## 2021-10-11 MED ORDER — ADULT MULTIVITAMIN W/MINERALS CH
1.0000 | ORAL_TABLET | Freq: Every day | ORAL | Status: DC
  Administered 2021-10-12 – 2021-10-13 (×2): 1 via ORAL
  Filled 2021-10-11 (×2): qty 1

## 2021-10-11 MED ORDER — ROSUVASTATIN CALCIUM 10 MG PO TABS
10.0000 mg | ORAL_TABLET | Freq: Every day | ORAL | Status: DC
  Administered 2021-10-12 – 2021-10-13 (×2): 10 mg via ORAL
  Filled 2021-10-11 (×2): qty 1

## 2021-10-11 MED ORDER — LOPERAMIDE HCL 2 MG PO CAPS: 2.0000 mg | ORAL_CAPSULE | ORAL | Status: DC | PRN

## 2021-10-11 MED ORDER — ASPIRIN EC 81 MG PO TBEC
81.0000 mg | DELAYED_RELEASE_TABLET | Freq: Every day | ORAL | Status: DC
  Administered 2021-10-12 – 2021-10-13 (×2): 81 mg via ORAL
  Filled 2021-10-11 (×2): qty 1

## 2021-10-11 MED ORDER — BENZTROPINE MESYLATE 1 MG PO TABS
2.0000 mg | ORAL_TABLET | Freq: Every day | ORAL | Status: DC
  Administered 2021-10-11 – 2021-10-13 (×3): 2 mg via ORAL
  Filled 2021-10-11 (×3): qty 2

## 2021-10-11 MED ORDER — TRAZODONE HCL 50 MG PO TABS
100.0000 mg | ORAL_TABLET | Freq: Every day | ORAL | Status: DC
  Administered 2021-10-11 – 2021-10-13 (×3): 100 mg via ORAL
  Filled 2021-10-11 (×3): qty 2

## 2021-10-11 MED ORDER — IBUPROFEN 400 MG PO TABS: 400.0000 mg | ORAL_TABLET | Freq: Four times a day (QID) | ORAL | Status: DC | PRN

## 2021-10-11 NOTE — ED Provider Notes (Signed)
?[place] ?Provider Note ? ? ?CSN: [id number] ?Arrival date & time: 10/11/21  1349 ? ?  ? ?History ? ?Chief Complaint  ?Patient presents with  ? Psychiatric Evaluation  ? ? ?Kyle Arellano is a 66 y.o. male. ? ?HPI ? ?This patient is a 66 year old male, presents to the hospital with a history of schizophrenia due to a complaint of acute psychosis.  According to the brother who took out IVC papers on the patient he has been calling the police stating that there is a large gang of homosexual Asians that are violent and they are after him.  He states that they are finding him through [place] which she calls "felony book".  He denies hearing any voices, denies seeing anything that is abnormal, he denies any physical complaints, he has not been sleeping very well though sometimes he does have good sleep which she describes as 2 to 3 hours.  No fevers no chills no headache no chest pain no shortness of breath.  It is unclear whether he is taking his medications.  He currently lives by himself.  Evidently his mother recently lived with him but she has since been moved to a nursing home. ? ?Home Medications ?Prior to Admission medications   ?Medication Sig Start Date End Date Taking? Authorizing Provider  ?aspirin EC 81 MG tablet Take 1 tablet (81 mg total) by mouth daily. Swallow whole. 09/22/21 12/21/21  [place] Eric J, DO  ?benztropine (COGENTIN) 2 MG tablet Take 2 mg by mouth at bedtime.    [provider]  ?cholecalciferol (VITAMIN D) 25 MCG (1000 UNIT) tablet Take 1,000 Units by mouth daily.    [provider]  ?ibuprofen (ADVIL) 200 MG tablet Take 400 mg by mouth every 6 (six) hours as needed for headache.    [provider]  ?loperamide (IMODIUM) 2 MG capsule Take 2 mg by mouth as needed for diarrhea or loose stools.    [provider]  ?Multiple Vitamin (MULTIVITAMIN) tablet Take 1 tablet by mouth daily.    [provider]  ?rosuvastatin (CRESTOR) 10  MG tablet Take 1 tablet (10 mg total) by mouth daily. (NEEDS TO BE SEEN BEFORE NEXT REFILL) 09/22/21 12/21/21  [place] Eric J, DO  ?traZODone (DESYREL) 100 MG tablet Take 1 tablet (100 mg total) by mouth at bedtime. for sleep. 09/24/21   Claretta Fraise, MD  ?TRINTELLIX 20 MG TABS tablet Take 20 mg by mouth every morning. 09/08/21   [provider]  ?   ? ?Allergies    ?Patient has no known allergies.   ? ?Review of Systems   ?Review of Systems  ?All other systems reviewed and are negative. ? ?Physical Exam ?Updated Vital Signs ?BP 127/89 (BP Location: Right Arm)   Pulse 82   Temp 98.7 ?F (37.1 ?C) (Oral)   Resp 20   Ht 1.702 m ('5\' 7"'$ )   Wt 90.9 kg   SpO2 99%   BMI 31.39 kg/m?  ?Physical Exam ?Vitals and nursing note reviewed.  ?Constitutional:   ?   General: He is not in acute distress. ?   Appearance: He is well-developed.  ?HENT:  ?   Head: Normocephalic and atraumatic.  ?   Mouth/Throat:  ?   Pharynx: No oropharyngeal exudate.  ?Eyes:  ?   General: No scleral icterus.    ?   Right eye: No discharge.     ?   Left eye: No discharge.  ?   Conjunctiva/sclera: Conjunctivae  normal.  ?   Pupils: Pupils are equal, round, and reactive to light.  ?Neck:  ?   Thyroid: No thyromegaly.  ?   Vascular: No JVD.  ?Cardiovascular:  ?   Rate and Rhythm: Normal rate and regular rhythm.  ?   Heart sounds: Normal heart sounds. No murmur heard. ?  No friction rub. No gallop.  ?Pulmonary:  ?   Effort: Pulmonary effort is normal. No respiratory distress.  ?   Breath sounds: Normal breath sounds. No wheezing or rales.  ?Abdominal:  ?   General: Bowel sounds are normal. There is no distension.  ?   Palpations: Abdomen is soft. There is no mass.  ?   Tenderness: There is no abdominal tenderness.  ?Musculoskeletal:     ?   General: No tenderness. Normal range of motion.  ?   Cervical back: Normal range of motion and neck supple.  ?Lymphadenopathy:  ?   Cervical: No cervical adenopathy.  ?Skin: ?   General: Skin is warm and dry.  ?    Findings: No erythema or rash.  ?Neurological:  ?   General: No focal deficit present.  ?   Mental Status: He is alert.  ?   Coordination: Coordination normal.  ?Psychiatric:  ?   Comments: The patient is clearly responding to internal stimuli, he does not appear to be a risk to himself or others with regards to physical violence but is not able to care for himself and is clearly decompensated with paranoia  ? ? ?ED Results / Procedures / Treatments   ?Labs ?(all labs ordered are listed, but only abnormal results are displayed) ?Labs Reviewed  ?RESP PANEL BY RT-PCR (FLU A&B, COVID) ARPGX2  ?COMPREHENSIVE METABOLIC PANEL  ?ETHANOL  ?RAPID URINE DRUG SCREEN, HOSP PERFORMED  ?CBC WITH DIFFERENTIAL/PLATELET  ? ? ?EKG ?None ? ?Radiology ?No results found. ? ?Procedures ?Procedures  ? ? ?Medications Ordered in ED ?Medications  ?aspirin EC tablet 81 mg (has no administration in time range)  ?benztropine (COGENTIN) tablet 2 mg (has no administration in time range)  ?cholecalciferol (VITAMIN D3) tablet 1,000 Units (has no administration in time range)  ?ibuprofen (ADVIL) tablet 400 mg (has no administration in time range)  ?loperamide (IMODIUM) capsule 2 mg (has no administration in time range)  ?rosuvastatin (CRESTOR) tablet 10 mg (has no administration in time range)  ?traZODone (DESYREL) tablet 100 mg (has no administration in time range)  ?vortioxetine HBr (TRINTELLIX) tablet 20 mg (has no administration in time range)  ?multivitamin with minerals tablet 1 tablet (has no administration in time range)  ? ? ?ED Course/ Medical Decision Making/ A&P ?  ?                        ?Medical Decision Making ?Amount and/or Complexity of Data Reviewed ?Labs: ordered. ? ?Risk ?OTC drugs. ?Prescription drug management. ? ? ?This patient presents to the ED for concern of acute psychosis, this involves an extensive number of treatment options, and is a complaint that carries with it a high risk of complications and morbidity.  The  differential diagnosis includes worsening psychosis, noncompliance with medications, worsening underlying medical condition which seems less likely given his longtime history of schizophrenia ? ? ?Co morbidities that complicate the patient evaluation ? ?Schizophrenia ? ? ?Additional history obtained: ? ?Additional history obtained from IVC papers and family member as well as prior medical record ?External records from outside source obtained and reviewed including recent admission  to the hospital at the beginning of April for what was thought to be a stroke however all of his imaging came back negative.  Seen by neurology and ultimately thought that this was acute onset of aphasia and mutism which had completely resolved while he was in the hospital. ? ? ?Lab Tests: ? ?I Ordered, and personally interpreted labs.  The pertinent results include: Labs pending at the time of change of shift ? ? ? ?Cardiac Monitoring: / EKG: ? ?The patient was maintained on a cardiac monitor.  I personally viewed and interpreted the cardiac monitored which showed an underlying rhythm of: Normal sinus rhythm ? ? ?Consultations Obtained: ? ?I requested consultation with the psychiatry,  and discussed lab and imaging findings as well as pertinent plan - they recommend: Pending at the time of change of shift ? ? ?Social Determinants of Health: ? ?Mental health illness with acute and chronic schizophrenia ? ? ?Test / Admission - Considered: ? ?Considering admission to the hospital, psychiatry consulted to determine disposition and assist with placement ? ? ?At the time of change of shift care signed out to Dr. Melina Copa to follow-up results and disposition accordingly ? ?At this time as of 3:15 PM the patient is cleared for psychiatric evaluation ? ? ? ? ? ? ? ? ?Final Clinical Impression(s) / ED Diagnoses ?Final diagnoses:  ?Acute psychosis ([place])  ?Paranoid delusion ([place])  ? ? ?Rx / DC Orders ?ED Discharge Orders   ? ? None  ? ?  ? ? ?   ?Noemi Chapel, MD ?10/11/21 1516 ? ?

## 2021-10-11 NOTE — ED Notes (Signed)
Locked up patient's belongings ( pants , belt , glasses , hat , shirt , socks and shoes) , placed in locker with labels ?

## 2021-10-11 NOTE — BH Assessment (Signed)
Comprehensive Clinical Assessment (CCA) Note ? ?10/11/2021 ?[place] ?[id number] ? ?Disposition: Per Oneida Alar, NP inpatient geri-psych treatment is recommended.  Disposition SW to pursue appropriate inpatient options. ? ?The patient demonstrates the following risk factors for suicide: Chronic risk factors for suicide include: psychiatric disorder of Schizoaffective Disorder unspecified . Acute risk factors for suicide include: social withdrawal/isolation. Protective factors for this patient include: positive social support, positive therapeutic relationship, hope for the future, and life satisfaction. Considering these factors, the overall suicide risk at this point appears to be low. Patient is appropriate for outpatient follow up once stabilized.  ? ? ?Patient is a @ year old male/male with a history of @,@,@ who presents voluntarily/involuntarily to [facility] for assessment.  Per EDP, ?This patient is a 66 year old male, presents to the hospital with a history of schizophrenia due to a complaint of acute psychosis.  According to the brother who took out IVC papers on the patient he has been calling the police stating that there is a large gang of homosexual Asians that are violent and they are after him.  He states that they are finding him through [place] which she calls "felony book".  He denies hearing any voices, denies seeing anything that is abnormal, he denies any physical complaints, he has not been sleeping very well though sometimes he does have good sleep which she describes as 2 to 3 hours.  It is unclear whether he is taking his medications.  He currently lives by himself.  Evidently his mother recently lived with him, and has since been moved to a nursing home.? ? ?Upon assessment, patient immediately began sharing about having to come in with police in ?handcuffs.  I thought I was under arrest.?   He believes he was brought in due to having problems with  ?felony book and fear for my life.?  He describes felony book as his name for facebook.  He shares that people are ?making my life a living hell and they sometimes drive people to suicide.?  Patient describes Asian homosexuals are threatening to ?do all kinds of perverted sexual things to me,  cut my body in pieces, flush me down toilet,  cut my head off and mount it on the wall.?  He does not know any of the individuals and states he has talked to ?deputies a couple times.?  Patient shares his mother recently went to a nursing home and he now lives alone. He admits to having a hard time living alone when ?these guys came and wrecked my life.? He admits to being off of his sleeping medicine for 3 years and was sleeping fine until ?they? began to torment me. Patient is intent on needing to warn everyone about ?felony book.?  He shares he is diagnosed with Schizoaffective Disorder and takes his medications as prescribed.  He states he followed by [facility], and he has his medications with appointment on July 12 at 11am.  He has food and all he needs. Patient is alert, pleasant and AAOx5.  He denies SI, HI, AVH or SA hx.  ? ?Per [phone] Tad Moore , brother/IVC petitioner, patient has been experiencing worsening paranoia for the past 2-3 weeks.  He shares patient has been stable for "years" up until a couple of weeks ago.  He has noticed worsening paranoia beginning a week after he was discharged from an inpatient admission for stroke symptoms.  Legrand Como confirms patient's fear of being attacked and killed noted above.  He also shares patient sent him a letter, informing him he needs to close his Facebook account immediately due to the threat.  Upon further chart review, it appears prolixin and wellbutrin wer discontinued during inpatient medical admission on 09/20/2021.  Patient was clearly not aware of medication changes.  He won't see his provider until 7/12 and appears to be decompensating rapidly.   Brother informed that inpatient treatment will be the recommendation.  He would like to be updated as he is the petitioner and very involved in patient's care.  ? ? ?Chief Complaint:  ?Chief Complaint  ?Patient presents with  ? Psychiatric Evaluation  ? ?Visit Diagnosis: Schizoaffective Disorder, unspecified  ? ? ?CCA Screening, Triage and Referral (STR) ? ?Patient Reported Information ?How did you hear about [place]? Legal System ? ?What Is the Reason for Your Visit/Call Today? Patient presetns under IVC, initiated by his brother, due to concerns he has become increasingly paranoid over the past week to two weeks.  Patient is pleasant and oriented currently, however he is quite paranoid, believing "an Asian homosexual group of people are stalking him and threatening to sexually assault him, kill him, dismember him, flush his body parts and burn his house down."  He has also been warning others and calling police with various related reports.  Per his brother this is new behavior, following last inpatient admission for stroke symptoms.  Patient is hyperfocused on the "threat" to him and others from this [place] group.  He denies SI, HI or SA hx. ? ?How Long Has This Been Causing You Problems? 1 wk - 1 month ? ?What Do You Feel Would Help You the Most Today? Treatment for Depression or other mood problem ? ? ?Have You Recently Had Any Thoughts About Hurting Yourself? No ? ?Are You Planning to Commit Suicide/Harm Yourself At This time? No ? ? ?Have you Recently Had Thoughts About [place]? No ? ?Are You Planning to Harm Someone at This Time? No ? ?Explanation: No data recorded ? ?Have You Used Any Alcohol or Drugs in the Past 24 Hours? No ? ?How Long Ago Did You Use Drugs or Alcohol? No data recorded ?What Did You Use and How Much? No data recorded ? ?Do You Currently Have a Therapist/Psychiatrist? Yes ? ?Name of Therapist/Psychiatrist: Patient shares he is followed by [facility] for med management with next  appt on 12/31/21 at 11am. ? ? ?Have You Been Recently Discharged From Any Office Practice or Programs? No ? ?Explanation of Discharge From Practice/Program: No data recorded ? ?  ?CCA Screening Triage Referral Assessment ?Type of Contact: Tele-Assessment ? ?Telemedicine Service Delivery: Telemedicine service delivery: This service was provided via telemedicine using a 2-way, interactive audio and video technology ? ?Is this Initial or Reassessment? Initial Assessment ? ?Date Telepsych consult ordered in [facility]:  10/11/21 ? ?Time Telepsych consult ordered in [facility]:  1841 ? ?Location of Assessment: [facility] ? ?Provider Location: [facility] ? ? ?Collateral Involvement: Collateral provided by patient's brother, ? ? ?Does Patient Have a Stage manager Guardian? No data recorded ?Name and Contact of Legal Guardian: No data recorded ?If Minor and Not Living with Parent(s), Who has Custody? No data recorded ?Is CPS involved or ever been involved? Never ? ?Is APS involved or ever been involved? Never ? ? ?Patient Determined To Be At Risk for Harm To Self or Others Based on Review of Patient Reported Information or Presenting Complaint? No ? ?Method: No data recorded ?Availability of  Means: No data recorded ?Intent: No data recorded ?Notification Required: No data recorded ?Additional Information for Danger to Others Potential: No data recorded ?Additional Comments for Danger to Others Potential: No data recorded ?Are There Guns or Other Weapons in [place]? No data recorded ?Types of Guns/Weapons: No data recorded ?Are These Weapons Safely Secured?                            No data recorded ?Who Could Verify You Are Able To Have These Secured: No data recorded ?Do You Have any Outstanding Charges, Pending Court Dates, Parole/Probation? No data recorded ?Contacted To Inform of Risk of Harm To Self or Others: No data recorded ? ? ?Does Patient Present under Involuntary Commitment? Yes ? ?IVC Papers Initial File  Date: 10/11/21 ? ? ?[place] of Residence: [place] ? ? ?Patient Currently Receiving the Following Services: Medication Management ? ? ?Determination of Need: Urgent (48 hours) ? ? ?Options For Referral: Inp

## 2021-10-11 NOTE — ED Triage Notes (Signed)
Patient BIB sheriff after brother IVCd patient for paranoia, poor hygiene and reporting that the chinese are going to kill everyone and hang their heads on the wall. Patient has hx of schizo from age 66 and recently lived with his mother but she has since been moved to a nursing home.   ?

## 2021-10-12 NOTE — Progress Notes (Signed)
Patient meets criteria for inpatient treatment per Oneida Alar, NP. No appropriate beds at [facility] currently. CSW faxed referrals to the following facilities for review: ? ? ?[facility]   ?[facility]   ?[facility]   ?[facility]   ?[facility]   ?[facility]   ?[place]   ?[facility]   ?[facility]   ?[facility]   ?[facility]   ?[facility]   ?[facility]   ?[facility]   ?[facility]  ? ? ? ?TTS will continue to seek bed placement.  ? ? ?  ? ?Darletta Moll MSW, LCSW ?Clincal Social Worker  ?[facility]  ?

## 2021-10-13 DIAGNOSIS — F23 Brief psychotic disorder: Secondary | ICD-10-CM

## 2021-10-13 DIAGNOSIS — I639 Cerebral infarction, unspecified: Secondary | ICD-10-CM | POA: Diagnosis not present

## 2021-10-13 DIAGNOSIS — R9431 Abnormal electrocardiogram [ECG] [EKG]: Secondary | ICD-10-CM | POA: Diagnosis not present

## 2021-10-13 LAB — RESP PANEL BY RT-PCR (FLU A&B, COVID) ARPGX2
Influenza A by PCR: NEGATIVE
Influenza B by PCR: NEGATIVE
SARS Coronavirus 2 by RT PCR: NEGATIVE

## 2021-10-13 NOTE — Progress Notes (Addendum)
[facility] LCSW Progress Note ?  ?10/13/2021    11:50 AM ? ?Kyle Arellano  ? ?[id number]  ? ?Type of Contact and Topic:  Psychiatric Bed Placement  ? ?Pt accepted to [facility]    ? ?Patient meets inpatient criteria per  Oneida Alar, NP ? ?The attending provider will be Myles Lipps, MD  ? ?Call report to [phone] ? ?Irena Reichmann, RN @ [facility] notified.    ? ?Pt scheduled  to arrive at [place] 0900. Please fax IVC paperwork prior to transporting this Pt.  ? ? ?Mariea Clonts, MSW, LCSW-A  ?11:51 AM 10/13/2021   ?  ? ?  ?  ? ? ? ? ?  ?

## 2021-10-13 NOTE — Progress Notes (Signed)
Patient has been denied by [facility] due to no bed available. Patient meets [place] inpatient criteria per Oneida Alar, NP. Patient has been faxed out to the following facilities:  ? ? ?

## 2021-10-13 NOTE — ED Provider Notes (Signed)
Emergency Medicine Observation Re-evaluation Note ? ?Kyle Arellano is a 65 y.o. male, seen on rounds today.  Pt initially presented to the ED for complaints of Psychiatric Evaluation ?Currently, the patient is awake and alert.  He still said he is not sleeping well.  He has been cooperative while here. ? ?Physical Exam  ?BP (!) 143/83 (BP Location: Left Arm)   Pulse 76   Temp 98.1 ?F (36.7 ?C) (Oral)   Resp 18   Ht '5\' 7"'$  (1.702 m)   Wt 90.9 kg   SpO2 98%   BMI 31.39 kg/m?  ?Physical Exam ?General: awake and alert ?Cardiac: rrr ?Lungs: cta ?Psych: calm ? ?ED Course / MDM  ?EKG:  ? ?I have reviewed the labs performed to date as well as medications administered while in observation.  Recent changes in the last 24 hours include TTS eval which recommends inpatient tx. ? ?Plan  ?Current plan is for inpatient placement. ?Kyle Arellano is under involuntary commitment. ?  ? ?  ?Isla Pence, MD ?10/13/21 0720 ? ?

## 2021-10-13 NOTE — ED Notes (Signed)
Telepsych cart at bedside  ?

## 2021-10-13 NOTE — Consult Note (Signed)
Telepsych Consultation  ? ?Reason for Consult:  Acute Psychosis ?Referring Physician:  Aileen Pilot MD ?Location of Patient: [facility] ?Location of Provider: [place] Department ? ?Patient Identification: Kyle Arellano ?MRN:  [id number] ?Principal Diagnosis: <principal problem not specified> ?Diagnosis:  Active Problems: ?  * No active hospital problems. * ? ?Total Time spent with patient: 1 hour ? ?Subjective:   ?Kyle Arellano is a 66 y.o. male geriatric patient who stated, "Some bad people were stoking me on Facebook and I will tell you about it when I leave the hospital." ? ?HPI: As per chart review from [facility]: [name]  is a 66 year old male, presents under IVC by brother to the [place] with a history of schizophrenia due to a complaint of acute psychosis.  According to the brother who took out IVC papers on the patient he has been calling the police stating that there is a large gang of homosexual Asians that are violent and they are after him.  He states that they are finding him through [place] which he calls "felony book".  He denies hearing any voices, denies seeing anything that is abnormal, he denies any physical complaints, he has not been sleeping very well though sometimes he does have good sleep which she describes as 2 to 3 hours.  No fevers no chills no headache no chest pain no shortness of breath.  It is unclear whether he is taking his medications.  He currently lives by himself.  Evidently his mother recently lived with him but she has since been moved to a nursing home. ? ?On assessment today via Telepsych, patient is observed lying on his bed and very talkative. Chart is reviewed and findings shared with the treatment team and Dr. Dwyane Dee. Patient is alert and oriented to person, time, place and situation. Speech is rapid and pressured. Mood and affect congruent, anxious and depressed. Thought process is coherent and linear. Thought content is delusional. Still admitting  that "some bad people are stoking me in Facebook. I called the police and thy took me to the hospital." Patient denied SI/HI/AVH. Denied access to firearms, self injurious behavior, drugs, alcohol or tobacco use or dependence. Endorsed good appetite and consuming 100% of his meals. Endorsed sleeping up to 4 hours last night. Endorsed he has been seeing a therapist and psychiatrist since he was 66 years old. Current seeing a therapist called Lattie Haw at [facility] who also manages his medications. Admits to family hx of mental health illness. Grandmother committed suicide when he was 66 years old. Stated he takes his medications as ordered except when he runs out. Able to remember some his medications by name. Psych medications reviewed with patient.  ? ?Disposition: Recommend psychiatric inpatient admission when medically cleared. ?Supportive therapy provided about ongoing stressors. Discussed crisis plan, support from social network, calling 911, coming to the Emergency Department, and calling Suicide Hotline. Patient is accepted to [facility] and would be discharged there when medically cleared. [facility] nursing staff and physician aware of patient disposition. ? ?Past Psychiatric History: Depression, Schizophrenia, Anxiety, Panic disorder, and Acute psychosis. ? ?Risk to Self:   ?Risk to Others:   ?Prior Inpatient Therapy:   ?Prior Outpatient Therapy:   ? ?Past Medical History:  ?Past Medical History:  ?Diagnosis Date  ? Depression   ? Hyperlipidemia   ? Schizophrenia ([place])   ? History reviewed. No pertinent surgical history. ?Family History:  ?Family History  ?Problem Relation Age of Onset  ?  Hypertension Mother   ? Cancer Father   ? Cirrhosis Father   ? Diabetes Father   ? ?Family Psychiatric  History: Grandmother committed suicide when patient was 66 years old  ? ?Social History:  ?Social History  ? ?Substance and Sexual Activity  ?Alcohol Use Never  ?   ?Social History  ? ?Substance and  Sexual Activity  ?Drug Use Never  ?  ?Social History  ? ?Socioeconomic History  ? Marital status: Single  ?  Spouse name: Not on file  ? Number of children: Not on file  ? Years of education: Not on file  ? Highest education level: Not on file  ?Occupational History  ? Not on file  ?Tobacco Use  ? Smoking status: Former  ? Smokeless tobacco: Never  ?Vaping Use  ? Vaping Use: Never used  ?Substance and Sexual Activity  ? Alcohol use: Never  ? Drug use: Never  ? Sexual activity: Not on file  ?Other Topics Concern  ? Not on file  ?Social History Narrative  ? Not on file  ? ?Social Determinants of Health  ? ?Financial Resource Strain: Not on file  ?Food Insecurity: Not on file  ?Transportation Needs: Not on file  ?Physical Activity: Not on file  ?Stress: Not on file  ?Social Connections: Not on file  ? ?Additional Social History: ?  ? ?Allergies:  No Known Allergies ? ?Labs:  ?Results for orders placed or performed during the hospital encounter of 10/11/21 (from the past 48 hour(s))  ?Urine rapid drug screen (hosp performed)     Status: None  ? Collection Time: 10/11/21  2:20 PM  ?Result Value Ref Range  ? Opiates NONE DETECTED NONE DETECTED  ? Cocaine NONE DETECTED NONE DETECTED  ? Benzodiazepines NONE DETECTED NONE DETECTED  ? Amphetamines NONE DETECTED NONE DETECTED  ? Tetrahydrocannabinol NONE DETECTED NONE DETECTED  ? Barbiturates NONE DETECTED NONE DETECTED  ?  Comment: (NOTE) ?DRUG SCREEN FOR MEDICAL PURPOSES ?ONLY.  IF CONFIRMATION IS NEEDED ?FOR ANY PURPOSE, NOTIFY LAB ?WITHIN 5 DAYS. ? ?LOWEST DETECTABLE LIMITS ?FOR URINE DRUG SCREEN ?Drug Class                     Cutoff (ng/mL) ?Amphetamine and metabolites    1000 ?Barbiturate and metabolites    200 ?Benzodiazepine                 200 ?Tricyclics and metabolites     300 ?Opiates and metabolites        300 ?Cocaine and metabolites        300 ?THC                            50 ?Performed at [facility], [address] [place], [place] [address] ?   ?Comprehensive metabolic panel     Status: Abnormal  ? Collection Time: 10/11/21  2:46 PM  ?Result Value Ref Range  ? Sodium 142 135 - 145 mmol/L  ? Potassium 3.7 3.5 - 5.1 mmol/L  ? Chloride 107 98 - 111 mmol/L  ? CO2 28 22 - 32 mmol/L  ? Glucose, Bld 91 70 - 99 mg/dL  ?  Comment: Glucose reference range applies only to samples taken after fasting for at least 8 hours.  ? BUN 26 (H) 8 - 23 mg/dL  ? Creatinine, Ser 1.09 0.61 - 1.24 mg/dL  ? Calcium 9.2 8.9 - 10.3 mg/dL  ? Total Protein  7.1 6.5 - 8.1 g/dL  ? Albumin 4.1 3.5 - 5.0 g/dL  ? AST 26 15 - 41 U/L  ? ALT 24 0 - 44 U/L  ? Alkaline Phosphatase 57 38 - 126 U/L  ? Total Bilirubin 0.6 0.3 - 1.2 mg/dL  ? GFR, Estimated >60 >60 mL/min  ?  Comment: (NOTE) ?Calculated using the CKD-EPI Creatinine Equation (2021) ?  ? Anion gap 7 5 - 15  ?  Comment: Performed at [facility], [address], [place], [place] [address]  ?Ethanol     Status: None  ? Collection Time: 10/11/21  2:46 PM  ?Result Value Ref Range  ? Alcohol, Ethyl (B) <10 <10 mg/dL  ?  Comment: (NOTE) ?Lowest detectable limit for serum alcohol is 10 mg/dL. ? ?For medical purposes only. ?Performed at [facility], [address], [place], [place] [address] ?  ?CBC with Diff     Status: None  ? Collection Time: 10/11/21  2:46 PM  ?Result Value Ref Range  ? WBC 5.5 4.0 - 10.5 K/uL  ? RBC 5.02 4.22 - 5.81 MIL/uL  ? Hemoglobin 15.0 13.0 - 17.0 g/dL  ? HCT 46.2 39.0 - 52.0 %  ? MCV 92.0 80.0 - 100.0 fL  ? MCH 29.9 26.0 - 34.0 pg  ? MCHC 32.5 30.0 - 36.0 g/dL  ? RDW 14.4 11.5 - 15.5 %  ? Platelets 265 150 - 400 K/uL  ? nRBC 0.0 0.0 - 0.2 %  ? Neutrophils Relative % 70 %  ? Neutro Abs 3.9 1.7 - 7.7 K/uL  ? Lymphocytes Relative 16 %  ? Lymphs Abs 0.9 0.7 - 4.0 K/uL  ? Monocytes Relative 8 %  ? Monocytes Absolute 0.5 0.1 - 1.0 K/uL  ? Eosinophils Relative 3 %  ? Eosinophils Absolute 0.2 0.0 - 0.5 K/uL  ? Basophils Relative 2 %  ? Basophils Absolute 0.1 0.0 - 0.1 K/uL  ? Immature Granulocytes 1 %  ? Abs Immature  Granulocytes 0.03 0.00 - 0.07 K/uL  ?  Comment: Performed at [facility], [address], [place], [place] [address]  ? ? ?Medications:  ?Current Facility-Administered Medications  ?Medication Dose Route Frequency Provider

## 2021-10-13 NOTE — ED Notes (Signed)
Patient to go to [facility] tomorrow after 0900.  ?Reporting Number is [phone] ?

## 2021-10-13 NOTE — ED Notes (Signed)
Patient very cooperative. Takes meds whole and independent ADLs.  ?

## 2021-10-14 NOTE — ED Notes (Signed)
Sheriff arrived to take pt to [facility] . Belongings sent with pt.  ?

## 2021-11-13 ENCOUNTER — Encounter: Payer: Self-pay | Admitting: Neurology

## 2021-11-13 ENCOUNTER — Ambulatory Visit: Payer: Medicare Other | Admitting: Neurology

## 2021-11-13 VITALS — BP 122/82 | HR 69 | Ht 67.5 in | Wt 196.5 lb

## 2021-11-13 DIAGNOSIS — F809 Developmental disorder of speech and language, unspecified: Secondary | ICD-10-CM

## 2021-11-13 NOTE — Progress Notes (Addendum)
Chief Complaint  Patient presents with   New Patient (Initial Visit)    Rm 14. Alone. NP internal referral for Aphasia.      ASSESSMENT AND PLAN  Kyle Arellano is a 66 y.o. male   Long history of schizoaffective disorder, on polypharmacy treatment, Hospital admission in April 2023 for difficulty talking,  MRI of the brain showed no acute abnormality,  CT angiogram of head and neck showed no significant large vessel disease,  Patient is returned to his baseline  Overnight video EEG monitoring was normal  He was even loaded with Keppra, now stopped,  His transient language difficulties most likely related to his underlying mental illness,  Continue follow-up with primary care and psychiatrist,   DIAGNOSTIC DATA (LABS, IMAGING, TESTING) - I reviewed patient records, labs, notes, testing and imaging myself where available.   MEDICAL HISTORY:  Kyle Arellano, is a 66 year old male, driven by his younger brother, but unknown at today's clinical visit, seen in request by   [place] Randall Hiss to follow-up hospital discharge in April 2023 I reviewed and summarized the referring note. PMHX.  September 20 2020, aphagia, [place],  PMHX. 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 49 due to long history of schizoaffective disorder, he reported a history of nervous breakdown required a shock treatment when he was young  But he is a very poor historian, tends to repeat questions, without clear story lines  [facility] hospital discharge in April 2023, he was admitted on September 20, 2021 for difficulty talking, he was talking with his friends on the phone, when all of a sudden he was not able to speak, lasting for few hours, he also reported confusion, eventually returned back to baseline  Had extensive evaluation, vital signs were stable, hemoglobin 14.9, MRI of the brain showed no acute abnormality,  mild small vessel disease,  CT angiogram head and neck showed large vessel disease  Had a video EEG monitoring no acute abnormality, was loaded with Keppra then stopped   PHYSICAL EXAM:   Vitals:   11/13/21 1103  BP: 122/82  Pulse: 69  Weight: 196 lb 8 oz (89.1 kg)  Height: 5' 7.5" (1.715 m)   Not recorded     Body mass index is 30.32 kg/m.  PHYSICAL EXAMNIATION:  Gen: NAD, conversant, well nourised, well groomed                     Cardiovascular: Regular rate rhythm, no peripheral edema, warm, nontender. Eyes: Conjunctivae clear without exudates or hemorrhage Neck: Supple, no carotid bruits. Pulmonary: Clear to auscultation bilaterally   NEUROLOGICAL EXAM:  MENTAL STATUS: Speech/cognition: Awake, alert, oriented to history taking and casual conversation CRANIAL NERVES: CN II: Visual fields are full to confrontation. Pupils are round equal and briskly reactive to light. CN III, IV, VI: extraocular movement are normal. No ptosis. CN V: Facial sensation is intact to light touch CN VII: Face is symmetric with normal eye closure  CN VIII: Hearing is normal to causal conversation. CN IX, X: Phonation is normal. CN XI: Head turning and shoulder shrug are intact  MOTOR: There is no pronator drift of out-stretched arms. Muscle bulk and tone are normal. Muscle strength is normal.  REFLEXES: Reflexes are 2+ and symmetric at the biceps, triceps, knees, and ankles. Plantar responses are flexor.  SENSORY: Intact to light touch, pinprick and vibratory sensation are intact  in fingers and toes.  COORDINATION: There is no trunk or limb dysmetria noted.  GAIT/STANCE: Posture is normal. Gait is steady with normal steps, base, arm swing, and turning. Heel and toe walking are normal. Tandem gait is normal.  Romberg is absent.  REVIEW OF SYSTEMS:  Full 14 system review of systems performed and notable only for as above All other review of systems were  negative.   ALLERGIES: No Known Allergies  HOME MEDICATIONS: Current Outpatient Medications  Medication Sig Dispense Refill   aspirin EC 81 MG tablet Take 1 tablet (81 mg total) by mouth daily. Swallow whole. 30 tablet 2   benztropine (COGENTIN) 2 MG tablet Take 2 mg by mouth at bedtime.     cholecalciferol (VITAMIN D) 25 MCG (1000 UNIT) tablet Take 1,000 Units by mouth daily.     ibuprofen (ADVIL) 200 MG tablet Take 200 mg by mouth every 6 (six) hours as needed for headache.     loperamide (IMODIUM) 2 MG capsule Take 2 mg by mouth as needed for diarrhea or loose stools.     Multiple Vitamin (MULTIVITAMIN) tablet Take 1 tablet by mouth daily.     rosuvastatin (CRESTOR) 10 MG tablet Take 1 tablet (10 mg total) by mouth daily. (NEEDS TO BE SEEN BEFORE NEXT REFILL) 30 tablet 2   traZODone (DESYREL) 100 MG tablet Take 1 tablet (100 mg total) by mouth at bedtime. for sleep. (Patient taking differently: Take 200 mg by mouth at bedtime. for sleep.) 30 tablet 2   TRINTELLIX 20 MG TABS tablet Take 20 mg by mouth every morning.     No current facility-administered medications for this visit.    PAST MEDICAL HISTORY: Past Medical History:  Diagnosis Date   Depression    Hyperlipidemia    Schizophrenia ([place])     PAST SURGICAL HISTORY: History reviewed. No pertinent surgical history.  FAMILY HISTORY: Family History  Problem Relation Age of Onset   Hypertension Mother    Cancer Father    Cirrhosis Father    Diabetes Father     SOCIAL HISTORY: Social History   Socioeconomic History   Marital status: Single    Spouse name: Not on file   Number of children: Not on file   Years of education: Not on file   Highest education level: Not on file  Occupational History   Not on file  Tobacco Use   Smoking status: Former   Smokeless tobacco: Never  Vaping Use   Vaping Use: Never used  Substance and Sexual Activity   Alcohol use: Never   Drug use: Never   Sexual activity: Not on  file  Other Topics Concern   Not on file  Social History Narrative   Not on file   Social Determinants of Health   Financial Resource Strain: Not on file  Food Insecurity: Not on file  Transportation Needs: Not on file  Physical Activity: Not on file  Stress: Not on file  Social Connections: Not on file  Intimate Partner Violence: Not on file      Marcial Pacas, M.D. Ph.D.  [facility] [address], [place], [place] [address] Ph: [phone] Fax: [phone]  CC:  [place] Eric J, DO [place],  [place] [address]  Claretta Fraise, MD

## 2021-12-11 ENCOUNTER — Other Ambulatory Visit: Payer: Self-pay | Admitting: Family Medicine

## 2022-03-31 ENCOUNTER — Ambulatory Visit (INDEPENDENT_AMBULATORY_CARE_PROVIDER_SITE_OTHER): Payer: Medicare Other | Admitting: Family Medicine

## 2022-03-31 ENCOUNTER — Encounter: Payer: Self-pay | Admitting: Family Medicine

## 2022-03-31 VITALS — BP 139/83 | HR 74 | Temp 98.2°F | Ht 67.5 in | Wt 218.0 lb

## 2022-03-31 DIAGNOSIS — F331 Major depressive disorder, recurrent, moderate: Secondary | ICD-10-CM | POA: Diagnosis not present

## 2022-03-31 DIAGNOSIS — G4709 Other insomnia: Secondary | ICD-10-CM | POA: Diagnosis not present

## 2022-03-31 DIAGNOSIS — Z1211 Encounter for screening for malignant neoplasm of colon: Secondary | ICD-10-CM

## 2022-03-31 DIAGNOSIS — E782 Mixed hyperlipidemia: Secondary | ICD-10-CM | POA: Diagnosis not present

## 2022-03-31 MED ORDER — TRAZODONE HCL 100 MG PO TABS: 200.0000 mg | ORAL_TABLET | Freq: Every day | ORAL | 1 refills | Status: DC

## 2022-03-31 NOTE — Progress Notes (Signed)
Subjective:  Patient ID: Kyle Arellano, male    DOB: [birth year]  Age: 66 y.o. MRN: [id number]  CC: Medical Management of Chronic Issues   HPI Kyle Arellano presents for  in for follow-up of elevated cholesterol. Doing well without complaints on current medication. Denies side effects of statin including myalgia and arthralgia and nausea. Currently no chest pain, shortness of breath or other cardiovascular related symptoms noted. Not sleeping well so he started taking two trazodone and sleeping much better.     03/31/2022   11:27 AM 09/24/2021    3:33 PM 09/24/2021    3:28 PM  Depression screen PHQ 2/9  Decreased Interest 0 0 0  Down, Depressed, Hopeless 1 1 0  PHQ - 2 Score 1 1 0  Altered sleeping 0 3   Tired, decreased energy 0 3   Change in appetite 0 0   Feeling bad or failure about yourself  0 0   Trouble concentrating 0 0   Moving slowly or fidgety/restless 0 0   Suicidal thoughts  0   PHQ-9 Score 1 7   Difficult doing work/chores Not difficult at all Not difficult at all     History Kyle Arellano has a past medical history of Depression, Hyperlipidemia, and Schizophrenia ([place]).   He has no past surgical history on file.   His family history includes Cancer in his father; Cirrhosis in his father; Diabetes in his father; Hypertension in his mother.He reports that he has quit smoking. He has never used smokeless tobacco. He reports that he does not drink alcohol and does not use drugs.    ROS Review of Systems  Constitutional:  Negative for fever.  Respiratory:  Negative for shortness of breath.   Cardiovascular:  Negative for chest pain.  Musculoskeletal:  Negative for arthralgias.  Skin:  Negative for rash.    Objective:  BP 139/83   Pulse 74   Temp 98.2 F (36.8 C)   Ht 5' 7.5" (1.715 m)   Wt 218 lb (98.9 kg)   SpO2 94%   BMI 33.64 kg/m   BP Readings from Last 3 Encounters:  03/31/22 139/83  11/13/21 122/82  10/14/21 (!) 126/92    Wt Readings from Last  3 Encounters:  03/31/22 218 lb (98.9 kg)  11/13/21 196 lb 8 oz (89.1 kg)  10/11/21 200 lb 6.4 oz (90.9 kg)     Physical Exam Vitals reviewed.  Constitutional:      Appearance: He is well-developed.  HENT:     Head: Normocephalic and atraumatic.     Right Ear: External ear normal.     Left Ear: External ear normal.     Mouth/Throat:     Pharynx: No oropharyngeal exudate or posterior oropharyngeal erythema.  Eyes:     Pupils: Pupils are equal, round, and reactive to light.  Cardiovascular:     Rate and Rhythm: Normal rate and regular rhythm.     Heart sounds: No murmur heard. Pulmonary:     Effort: No respiratory distress.     Breath sounds: Normal breath sounds.  Musculoskeletal:     Cervical back: Normal range of motion and neck supple.  Neurological:     Mental Status: He is alert and oriented to person, place, and time.       Assessment & Plan:   Kyle Arellano was seen today for medical management of chronic issues.  Diagnoses and all orders for this visit:  Mixed hyperlipidemia -     CBC with  Differential/Platelet -     CMP14+EGFR -     Lipid panel -     Thyroid Panel With TSH  Moderate episode of recurrent major depressive disorder (HCC) -     CBC with Differential/Platelet -     CMP14+EGFR -     Lipid panel -     Thyroid Panel With TSH  Screen for colon cancer -     Ambulatory referral to Gastroenterology  Other insomnia  Other orders -     traZODone (DESYREL) 100 MG tablet; Take 2 tablets (200 mg total) by mouth at bedtime. for sleep.       I have discontinued Pete O. Bendickson's loperamide. I have also changed his traZODone. Additionally, I am having him maintain his benztropine, Trintellix, multivitamin, cholecalciferol, ibuprofen, rosuvastatin, buPROPion, and fluPHENAZine.  Allergies as of 03/31/2022   No Known Allergies      Medication List        Accurate as of March 31, 2022 11:59 PM. If you have any questions, ask your nurse or  doctor.          STOP taking these medications    loperamide 2 MG capsule Commonly known as: IMODIUM Stopped by: Claretta Fraise, MD       TAKE these medications    benztropine 2 MG tablet Commonly known as: COGENTIN Take 2 mg by mouth at bedtime.   buPROPion 150 MG 12 hr tablet Commonly known as: WELLBUTRIN SR Take by mouth.   cholecalciferol 25 MCG (1000 UNIT) tablet Commonly known as: VITAMIN D3 Take 1,000 Units by mouth daily.   fluPHENAZine 10 MG tablet Commonly known as: PROLIXIN Take 10 mg by mouth every morning.   ibuprofen 200 MG tablet Commonly known as: ADVIL Take 200 mg by mouth every 6 (six) hours as needed for headache.   multivitamin tablet Take 1 tablet by mouth daily.   rosuvastatin 10 MG tablet Commonly known as: CRESTOR TAKE ONE TABLET ONCE DAILY   traZODone 100 MG tablet Commonly known as: DESYREL Take 2 tablets (200 mg total) by mouth at bedtime. for sleep.   Trintellix 20 MG Tabs tablet Generic drug: vortioxetine HBr Take 20 mg by mouth every morning.         Follow-up: Return in about 6 months (around 09/30/2022).  Claretta Fraise, M.D.

## 2022-04-01 LAB — CMP14+EGFR
ALT: 24 IU/L (ref 0–44)
AST: 21 IU/L (ref 0–40)
Albumin/Globulin Ratio: 2 (ref 1.2–2.2)
Albumin: 4.4 g/dL (ref 3.9–4.9)
Alkaline Phosphatase: 76 IU/L (ref 44–121)
BUN/Creatinine Ratio: 20 (ref 10–24)
BUN: 24 mg/dL (ref 8–27)
Bilirubin Total: 0.4 mg/dL (ref 0.0–1.2)
CO2: 25 mmol/L (ref 20–29)
Calcium: 9.2 mg/dL (ref 8.6–10.2)
Chloride: 103 mmol/L (ref 96–106)
Creatinine, Ser: 1.21 mg/dL (ref 0.76–1.27)
Globulin, Total: 2.2 g/dL (ref 1.5–4.5)
Glucose: 90 mg/dL (ref 70–99)
Potassium: 4.7 mmol/L (ref 3.5–5.2)
Sodium: 141 mmol/L (ref 134–144)
Total Protein: 6.6 g/dL (ref 6.0–8.5)
eGFR: 66 mL/min/{1.73_m2} (ref >59)

## 2022-04-01 LAB — CBC WITH DIFFERENTIAL/PLATELET
Basophils Absolute: 0.1 10*3/uL (ref 0.0–0.2)
Basos: 2 %
EOS (ABSOLUTE): 0.2 10*3/uL (ref 0.0–0.4)
Eos: 3 %
Hematocrit: 48 % (ref 37.5–51.0)
Hemoglobin: 16 g/dL (ref 13.0–17.7)
Immature Grans (Abs): 0.1 10*3/uL (ref 0.0–0.1)
Immature Granulocytes: 1 %
Lymphocytes Absolute: 1.5 10*3/uL (ref 0.7–3.1)
Lymphs: 19 %
MCH: 29.9 pg (ref 26.6–33.0)
MCHC: 33.3 g/dL (ref 31.5–35.7)
MCV: 90 fL (ref 79–97)
Monocytes Absolute: 0.6 10*3/uL (ref 0.1–0.9)
Monocytes: 7 %
Neutrophils Absolute: 5.4 10*3/uL (ref 1.4–7.0)
Neutrophils: 68 %
Platelets: 220 10*3/uL (ref 150–450)
RBC: 5.36 x10E6/uL (ref 4.14–5.80)
RDW: 14 % (ref 11.6–15.4)
WBC: 7.8 10*3/uL (ref 3.4–10.8)

## 2022-04-01 LAB — THYROID PANEL WITH TSH
Free Thyroxine Index: 2 (ref 1.2–4.9)
T3 Uptake Ratio: 28 % (ref 24–39)
T4, Total: 7.3 ug/dL (ref 4.5–12.0)
TSH: 2.02 u[IU]/mL (ref 0.450–4.500)

## 2022-04-01 LAB — LIPID PANEL
Chol/HDL Ratio: 3.8 ratio (ref 0.0–5.0)
Cholesterol, Total: 158 mg/dL (ref 100–199)
HDL: 42 mg/dL (ref >39)
LDL Chol Calc (NIH): 81 mg/dL (ref 0–99)
Triglycerides: 207 mg/dL — ABNORMAL HIGH (ref 0–149)
VLDL Cholesterol Cal: 35 mg/dL (ref 5–40)

## 2022-04-04 DIAGNOSIS — G4709 Other insomnia: Secondary | ICD-10-CM | POA: Insufficient documentation

## 2022-04-04 NOTE — Progress Notes (Signed)
Hello Shaka,  Your lab result is normal and/or stable.Some minor variations that are not significant are commonly marked abnormal, but do not represent any medical problem for you.  Best regards, Korra Christine, M.D.

## 2022-04-06 ENCOUNTER — Encounter (INDEPENDENT_AMBULATORY_CARE_PROVIDER_SITE_OTHER): Payer: Self-pay | Admitting: *Deleted

## 2022-04-30 ENCOUNTER — Telehealth (INDEPENDENT_AMBULATORY_CARE_PROVIDER_SITE_OTHER): Payer: Self-pay | Admitting: *Deleted

## 2022-04-30 MED ORDER — PEG 3350-KCL-NA BICARB-NACL 420 G PO SOLR
4000.0000 mL | Freq: Once | ORAL | 0 refills | Status: AC
Start: 2022-04-30 — End: 2022-04-30

## 2022-04-30 NOTE — Telephone Encounter (Signed)
Thanks

## 2022-04-30 NOTE — Telephone Encounter (Signed)
Spoke with pt. He did not want to schedule until January, Scheduled room 1 on 07/03/22 at [facility] will mail instructions to him. Rx for prep to be sent to pharmacy. Confirmed address and pharmacy. He also stated insurance will remain the same for next year

## 2022-04-30 NOTE — Telephone Encounter (Signed)
Referral completed

## 2022-04-30 NOTE — Telephone Encounter (Signed)
  Procedure: colonoscopy  Have you had a colonoscopy before?  2008  Do you have family history of colon cancer  no  Previous colonoscopy with polyps removed? no  Do you have a history colorectal cancer?   no  Are you diabetic?  no  Do you have a prosthetic or mechanical heart valve? no  Do you have a pacemaker/defibrillator?   no  Have you had endocarditis/atrial fibrillation?  no  Do you use supplemental oxygen/CPAP?  no  Have you had joint replacement within the last 12 months?  no  Do you tend to be constipated or have to use laxatives?  no   Do you have history of alcohol use? If yes, how much and how often.  no  Do you have history or are you using drugs? If yes, what do are you  using?  no  Have you ever had a stroke/heart attack?  no  Do you take weight loss medication? no  Do you take any blood-thinning medications such as: (Plavix, aspirin, Coumadin, Aggrenox, Brilinta, Xarelto, Eliquis, Pradaxa, Savaysa or Effient)? NO  If yes we need the name, milligram, dosage and who is prescribing doctor:               Current Outpatient Medications  Medication Sig Dispense Refill   benztropine (COGENTIN) 2 MG tablet Take 2 mg by mouth at bedtime.     buPROPion (WELLBUTRIN SR) 150 MG 12 hr tablet Take by mouth.     fluPHENAZine (PROLIXIN) 10 MG tablet Take 10 mg by mouth every morning.     rosuvastatin (CRESTOR) 10 MG tablet TAKE ONE TABLET ONCE DAILY 90 tablet 3   traZODone (DESYREL) 100 MG tablet Take 2 tablets (200 mg total) by mouth at bedtime. for sleep. 180 tablet 1   TRINTELLIX 20 MG TABS tablet Take 20 mg by mouth every morning.     No current facility-administered medications for this visit.    No Known Allergies

## 2022-06-01 ENCOUNTER — Ambulatory Visit (INDEPENDENT_AMBULATORY_CARE_PROVIDER_SITE_OTHER): Payer: Medicare Other | Admitting: Nurse Practitioner

## 2022-06-01 ENCOUNTER — Encounter: Payer: Self-pay | Admitting: Nurse Practitioner

## 2022-06-01 VITALS — BP 124/79 | HR 66 | Temp 98.1°F | Resp 20 | Ht 67.0 in | Wt 227.0 lb

## 2022-06-01 DIAGNOSIS — H6123 Impacted cerumen, bilateral: Secondary | ICD-10-CM | POA: Diagnosis not present

## 2022-06-01 NOTE — Patient Instructions (Signed)

## 2022-06-01 NOTE — Progress Notes (Signed)
   Subjective:    Patient ID: Kyle Arellano, male    DOB: June 27, 1955, 66 y.o.   MRN: [id number]   Chief Complaint: ears stopped up   Pt comes in today for c/o ears stopped up x 2 weeks; states he has to have them flushed out often. No pain, or other symptoms; his main complaint is that he "just can't hear good when they're like this". States he does use debrox drops at home, but they do not help.       Review of Systems  HENT:  Negative for congestion, ear discharge and ear pain.   Respiratory:  Negative for chest tightness and shortness of breath.   Cardiovascular:  Negative for chest pain.  Neurological:  Negative for dizziness and headaches.  All other systems reviewed and are negative.      Objective:   Physical Exam Vitals and nursing note reviewed.  Constitutional:      General: He is not in acute distress.    Appearance: Normal appearance.  HENT:     Head: Normocephalic and atraumatic.     Right Ear: There is impacted cerumen.     Left Ear: There is impacted cerumen.     Ears:     Comments: S/p irrigation TM intact    Nose: Nose normal.  Cardiovascular:     Rate and Rhythm: Normal rate and regular rhythm.  Pulmonary:     Effort: Pulmonary effort is normal. No respiratory distress.  Neurological:     General: No focal deficit present.     Mental Status: He is alert and oriented to person, place, and time.  Psychiatric:        Mood and Affect: Mood normal.        Behavior: Behavior normal.     BP 124/79   Pulse 66   Temp 98.1 F (36.7 C) (Temporal)   Resp 20   Ht '5\' 7"'$  (1.702 m)   Wt 227 lb (103 kg)   SpO2 95%   BMI 35.55 kg/m   Status post bil ear irrigation- TM's clear bil     Assessment & Plan:   Kyle Arellano in today with chief complaint of ears stopped up   1. Bilateral impacted cerumen Debrox several times a week. Do not use qtips in ear canal    The above assessment and management plan was discussed with the patient. The patient  verbalized understanding of and has agreed to the management plan. Patient is aware to call the clinic if symptoms persist or worsen. Patient is aware when to return to the clinic for a follow-up visit. Patient educated on when it is appropriate to go to the emergency department.   Kyle Arellano Done, FNP

## 2022-06-23 ENCOUNTER — Telehealth: Payer: Self-pay | Admitting: *Deleted

## 2022-06-23 NOTE — Telephone Encounter (Signed)
PA done via [facility]. Auth# [id number], dos: Jul 03, 2022 - Oct 01, 2022

## 2022-07-03 ENCOUNTER — Encounter (HOSPITAL_COMMUNITY)
Admission: RE | Disposition: A | Discharge status: Home / Self Care | Payer: Self-pay | Source: Home / Self Care | Attending: Gastroenterology

## 2022-07-03 ENCOUNTER — Encounter (HOSPITAL_COMMUNITY): Payer: Self-pay | Admitting: Gastroenterology

## 2022-07-03 ENCOUNTER — Ambulatory Visit (HOSPITAL_COMMUNITY)
Admission: RE | Admit: 2022-07-03 | Discharge: 2022-07-03 | Disposition: A | Discharge status: Home / Self Care | Payer: Medicare Other | Attending: Gastroenterology | Admitting: Gastroenterology

## 2022-07-03 ENCOUNTER — Other Ambulatory Visit: Payer: Self-pay

## 2022-07-03 ENCOUNTER — Ambulatory Visit (HOSPITAL_BASED_OUTPATIENT_CLINIC_OR_DEPARTMENT_OTHER): Payer: Medicare Other | Admitting: Anesthesiology

## 2022-07-03 ENCOUNTER — Ambulatory Visit (HOSPITAL_COMMUNITY): Payer: Medicare Other | Admitting: Anesthesiology

## 2022-07-03 DIAGNOSIS — K648 Other hemorrhoids: Secondary | ICD-10-CM | POA: Insufficient documentation

## 2022-07-03 DIAGNOSIS — D124 Benign neoplasm of descending colon: Secondary | ICD-10-CM | POA: Diagnosis not present

## 2022-07-03 DIAGNOSIS — K5730 Diverticulosis of large intestine without perforation or abscess without bleeding: Secondary | ICD-10-CM | POA: Diagnosis not present

## 2022-07-03 DIAGNOSIS — E785 Hyperlipidemia, unspecified: Secondary | ICD-10-CM | POA: Diagnosis not present

## 2022-07-03 DIAGNOSIS — F32A Depression, unspecified: Secondary | ICD-10-CM | POA: Insufficient documentation

## 2022-07-03 DIAGNOSIS — Z87891 Personal history of nicotine dependence: Secondary | ICD-10-CM | POA: Insufficient documentation

## 2022-07-03 DIAGNOSIS — Z1211 Encounter for screening for malignant neoplasm of colon: Secondary | ICD-10-CM | POA: Insufficient documentation

## 2022-07-03 DIAGNOSIS — K635 Polyp of colon: Secondary | ICD-10-CM | POA: Diagnosis not present

## 2022-07-03 DIAGNOSIS — Z860100 Personal history of colon polyps, unspecified: Secondary | ICD-10-CM

## 2022-07-03 DIAGNOSIS — F209 Schizophrenia, unspecified: Secondary | ICD-10-CM | POA: Diagnosis not present

## 2022-07-03 DIAGNOSIS — Z139 Encounter for screening, unspecified: Secondary | ICD-10-CM | POA: Diagnosis not present

## 2022-07-03 HISTORY — PX: COLONOSCOPY WITH PROPOFOL: SHX5780

## 2022-07-03 HISTORY — PX: POLYPECTOMY: SHX5525

## 2022-07-03 LAB — HM COLONOSCOPY

## 2022-07-03 SURGERY — COLONOSCOPY WITH PROPOFOL
Anesthesia: General

## 2022-07-03 MED ORDER — PROPOFOL 500 MG/50ML IV EMUL
INTRAVENOUS | Status: DC | PRN
  Administered 2022-07-03: 150 ug/kg/min via INTRAVENOUS

## 2022-07-03 MED ORDER — PHENYLEPHRINE HCL (PRESSORS) 10 MG/ML IV SOLN
INTRAVENOUS | Status: DC | PRN
  Administered 2022-07-03: 160 ug via INTRAVENOUS

## 2022-07-03 MED ORDER — LACTATED RINGERS IV SOLN: INTRAVENOUS | Status: DC | PRN

## 2022-07-03 MED ORDER — PROPOFOL 10 MG/ML IV BOLUS
INTRAVENOUS | Status: DC | PRN
  Administered 2022-07-03: 60 mg via INTRAVENOUS

## 2022-07-03 NOTE — Anesthesia Postprocedure Evaluation (Signed)
Anesthesia Post Note  Patient: Zade Falkner Teaster  Procedure(s) Performed: COLONOSCOPY WITH PROPOFOL POLYPECTOMY  Patient location during evaluation: Endoscopy Anesthesia Type: General Level of consciousness: awake and alert Pain management: pain level controlled Vital Signs Assessment: post-procedure vital signs reviewed and stable Respiratory status: spontaneous breathing, nonlabored ventilation, respiratory function stable and patient connected to nasal cannula oxygen Cardiovascular status: blood pressure returned to baseline and stable Postop Assessment: no apparent nausea or vomiting Anesthetic complications: no   There were no known notable events for this encounter.   Last Vitals:  Vitals:   07/03/22 0858 07/03/22 0903  BP: 132/80 128/79  Pulse: 83 81  Resp: (!) 38 (!) 26  Temp: 36.9 C   SpO2: 98% 95%    Last Pain:  Vitals:   07/03/22 0903  TempSrc:   PainSc: 0-No pain                 Trixie Rude

## 2022-07-03 NOTE — H&P (Signed)
Kyle Arellano is an 67 y.o. male.   Chief Complaint: History of colon polyps. HPI: 67 year old male with past medical history depression, hyperlipidemia, schizophrenia, coming for history of colonic polyps.  The patient denies having any nausea, vomiting, fever, chills, hematochezia, melena, hematemesis, abdominal distention, abdominal pain, diarrhea, jaundice, pruritus or weight loss. Last colonoscopy performed in 2008, had 1 polyp in the cecum removed which was a tubular adenoma.  No family history of colorectal cancer.  Past Medical History:  Diagnosis Date   Depression    Hyperlipidemia    Schizophrenia ([place])     Past Surgical History:  Procedure Laterality Date   COLONOSCOPY     Right foot surgery      Family History  Problem Relation Age of Onset   Hypertension Mother    Cancer Father    Cirrhosis Father    Diabetes Father    Colon cancer Neg Hx    Social History:  reports that he has quit smoking. He has never used smokeless tobacco. He reports that he does not drink alcohol and does not use drugs.  Allergies: No Known Allergies  Medications Prior to Admission  Medication Sig Dispense Refill   aspirin EC 81 MG tablet Take 81 mg by mouth in the morning. Swallow whole.     benztropine (COGENTIN) 2 MG tablet Take 2 mg by mouth every other day. At bedtime.     buPROPion (WELLBUTRIN SR) 150 MG 12 hr tablet Take 150-300 mg by mouth See admin instructions. Take 2 tablets (300 mg) by mouth in the morning & take 1 tablet (150 mg) by mouth at night.     Cholecalciferol (VITAMIN D-3 PO) Take 1 tablet by mouth in the morning.     fluPHENAZine (PROLIXIN) 10 MG tablet Take 10 mg by mouth every morning.     ibuprofen (ADVIL) 200 MG tablet Take 400 mg by mouth every 8 (eight) hours as needed (headaches/pain.).     Multiple Vitamin (MULTIVITAMIN WITH MINERALS) TABS tablet Take 1 tablet by mouth in the morning.     polyethylene glycol-electrolytes (NULYTELY) 420 g solution Take 4,000  mLs by mouth once.     rosuvastatin (CRESTOR) 10 MG tablet TAKE ONE TABLET ONCE DAILY 90 tablet 3   traZODone (DESYREL) 100 MG tablet Take 2 tablets (200 mg total) by mouth at bedtime. for sleep. 180 tablet 1   TRINTELLIX 20 MG TABS tablet Take 20 mg by mouth every morning.      No results found for this or any previous visit (from the past 48 hour(s)). No results found.  Review of Systems  All other systems reviewed and are negative.   Blood pressure (!) 151/92, pulse 94, temperature 97.8 F (36.6 C), temperature source Oral, resp. rate (!) 30, height 5' 7.5" (1.715 m), weight 103 kg, SpO2 96 %. Physical Exam  GENERAL: The patient is AO x3, in no acute distress. HEENT: Head is normocephalic and atraumatic. EOMI are intact. Mouth is well hydrated and without lesions. NECK: Supple. No masses LUNGS: Clear to auscultation. No presence of rhonchi/wheezing/rales. Adequate chest expansion HEART: RRR, normal s1 and s2. ABDOMEN: Soft, nontender, no guarding, no peritoneal signs, and nondistended. BS +. No masses. EXTREMITIES: Without any cyanosis, clubbing, rash, lesions or edema. NEUROLOGIC: AOx3, no focal motor deficit. SKIN: no jaundice, no rashes  Assessment/Plan 67 year old male with past medical history depression, hyperlipidemia, schizophrenia, coming for history of colonic polyps.  Will proceed with colonoscopy.  Harvel Quale, MD 07/03/2022, 8:14  AM    

## 2022-07-03 NOTE — Anesthesia Preprocedure Evaluation (Signed)
Anesthesia Evaluation  Patient identified by MRN, date of birth, ID band Patient awake    Reviewed: Allergy & Precautions, NPO status , Patient's Chart, lab work & pertinent test results, reviewed documented beta blocker date and time   Airway Mallampati: II       Dental no notable dental hx. (+) Poor Dentition, Chipped,    Pulmonary neg pulmonary ROS, former smoker   Pulmonary exam normal        Cardiovascular Exercise Tolerance: Good negative cardio ROS Normal cardiovascular exam     Neuro/Psych  PSYCHIATRIC DISORDERS Anxiety Depression  Schizophrenia  negative neurological ROS     GI/Hepatic negative GI ROS, Neg liver ROS,,,  Endo/Other  negative endocrine ROS    Renal/GU negative Renal ROS  negative genitourinary   Musculoskeletal negative musculoskeletal ROS (+)    Abdominal Normal abdominal exam  (+)   Peds  Hematology negative hematology ROS (+)   Anesthesia Other Findings   Reproductive/Obstetrics                             Anesthesia Physical Anesthesia Plan  ASA: 2  Anesthesia Plan: General   Post-op Pain Management:    Induction: Intravenous  PONV Risk Score and Plan: Propofol infusion and TIVA  Airway Management Planned: Nasal Cannula and Natural Airway  Additional Equipment:   Intra-op Plan:   Post-operative Plan:   Informed Consent: I have reviewed the patients History and Physical, chart, labs and discussed the procedure including the risks, benefits and alternatives for the proposed anesthesia with the patient or authorized representative who has indicated his/her understanding and acceptance.       Plan Discussed with: CRNA  Anesthesia Plan Comments:        Anesthesia Quick Evaluation

## 2022-07-03 NOTE — Transfer of Care (Signed)
Immediate Anesthesia Transfer of Care Note  Patient: Kyle Arellano  Procedure(s) Performed: COLONOSCOPY WITH PROPOFOL POLYPECTOMY  Patient Location: PACU  Anesthesia Type:General  Level of Consciousness: drowsy and patient cooperative  Airway & Oxygen Therapy: Patient Spontanous Breathing and Patient connected to face mask  Post-op Assessment: Report given to RN and Post -op Vital signs reviewed and stable  Post vital signs: Reviewed and stable  Last Vitals:  Vitals Value Taken Time  BP 132/80 07/03/22 0858  Temp 36.9 C 07/03/22 0858  Pulse 83 07/03/22 0858  Resp 38 07/03/22 0858  SpO2 98 % 07/03/22 0858    Last Pain:  Vitals:   07/03/22 0858  TempSrc: Axillary  PainSc:       Patients Stated Pain Goal: 3 (09/40/76 8088)  Complications: No notable events documented.

## 2022-07-03 NOTE — Op Note (Signed)
[facility] Patient Name: Kyle Arellano Procedure Date: 07/03/2022 8:09 AM MRN: [id number] Date of Birth: [birth year] Attending MD: Maylon Peppers , , [id number] CSN: [id number] Age: 67 Admit Type: Outpatient Procedure:                Colonoscopy Indications:              Surveillance: Personal history of adenomatous                            polyps on last colonoscopy > 5 years ago Providers:                Maylon Peppers, Janeece Riggers, RN, Illene Labrador Referring MD:              Medicines:                Monitored Anesthesia Care Complications:            No immediate complications. Estimated Blood Loss:     Estimated blood loss: none. Procedure:                Pre-Anesthesia Assessment:                           - Prior to the procedure, a History and Physical                            was performed, and patient medications, allergies                            and sensitivities were reviewed. The patient's                            tolerance of previous anesthesia was reviewed.                           - The risks and benefits of the procedure and the                            sedation options and risks were discussed with the                            patient. All questions were answered and informed                            consent was obtained.                           - ASA Grade Assessment: III - A patient with severe                            systemic disease.                           After obtaining informed consent, the colonoscope                            was passed under  direct vision. Throughout the                            procedure, the patient's blood pressure, pulse, and                            oxygen saturations were monitored continuously. The                            [id number]-HQ190L ([id number]) scope was introduced through                            the anus and advanced to the the cecum, identified                            by appendiceal  orifice and ileocecal valve. The                            colonoscopy was performed without difficulty. The                            patient tolerated the procedure well. The quality                            of the bowel preparation was adequate. Scope In: 8:34:12 AM Scope Out: 8:52:36 AM Scope Withdrawal Time: 0 hours 12 minutes 12 seconds  Total Procedure Duration: 0 hours 18 minutes 24 seconds  Findings:      The perianal and digital rectal examinations were normal.      A 4 mm polyp was found in the descending colon. The polyp was sessile.       The polyp was removed with a cold snare. Resection and retrieval were       complete.      Scattered small-mouthed diverticula were found in the sigmoid colon.      Non-bleeding internal hemorrhoids were found during retroflexion. The       hemorrhoids were small. Impression:               - One 4 mm polyp in the descending colon, removed                            with a cold snare. Resected and retrieved.                           - Diverticulosis in the sigmoid colon.                           - Non-bleeding internal hemorrhoids. Moderate Sedation:      Per Anesthesia Care Recommendation:           - Discharge patient to home (ambulatory).                           - Resume previous diet.                           -  Await pathology results.                           - Repeat colonoscopy for surveillance based on                            pathology results. Procedure Code(s):        --- Professional ---                           [phone], Colonoscopy, flexible; with removal of                            tumor(s), polyp(s), or other lesion(s) by snare                            technique Diagnosis Code(s):        --- Professional ---                           Z86.010, Personal history of colonic polyps                           D12.4, Benign neoplasm of descending colon                           K64.8, Other hemorrhoids                            K57.30, Diverticulosis of large intestine without                            perforation or abscess without bleeding CPT copyright 2022 [employer]. All rights reserved. The codes documented in this report are preliminary and upon coder review may  be revised to meet current compliance requirements. Maylon Peppers, MD Maylon Peppers,  07/03/2022 9:00:13 AM This report has been signed electronically. Number of Addenda: 0

## 2022-07-03 NOTE — Discharge Instructions (Signed)
You are being discharged to home.  Resume your previous diet.  We are waiting for your pathology results.  Your physician has recommended a repeat colonoscopy for surveillance based on pathology results.  

## 2022-07-06 ENCOUNTER — Encounter (INDEPENDENT_AMBULATORY_CARE_PROVIDER_SITE_OTHER): Payer: Self-pay | Admitting: *Deleted

## 2022-07-06 LAB — SURGICAL PATHOLOGY

## 2022-07-09 ENCOUNTER — Other Ambulatory Visit: Payer: Self-pay | Admitting: Family Medicine

## 2022-07-09 ENCOUNTER — Encounter (HOSPITAL_COMMUNITY): Payer: Self-pay | Admitting: Gastroenterology

## 2022-07-10 MED ORDER — ROSUVASTATIN CALCIUM 10 MG PO TABS: 10.0000 mg | ORAL_TABLET | Freq: Every day | ORAL | 0 refills | Status: DC

## 2022-07-10 NOTE — Addendum Note (Signed)
Addended by: Antonietta Barcelona D on: 07/10/2022 10:30 AM   Modules accepted: Orders

## 2022-07-10 NOTE — Telephone Encounter (Signed)
Fax from [place] for 100-d supply, refill sent, yesteray's request said 90-d/100-d

## 2022-09-30 ENCOUNTER — Ambulatory Visit (INDEPENDENT_AMBULATORY_CARE_PROVIDER_SITE_OTHER): Payer: Medicare Other | Admitting: Family Medicine

## 2022-09-30 ENCOUNTER — Encounter: Payer: Self-pay | Admitting: Family Medicine

## 2022-09-30 VITALS — BP 119/74 | HR 72 | Temp 98.0°F | Ht 67.5 in | Wt 222.2 lb

## 2022-09-30 DIAGNOSIS — D126 Benign neoplasm of colon, unspecified: Secondary | ICD-10-CM | POA: Diagnosis not present

## 2022-09-30 DIAGNOSIS — G4709 Other insomnia: Secondary | ICD-10-CM | POA: Diagnosis not present

## 2022-09-30 DIAGNOSIS — E782 Mixed hyperlipidemia: Secondary | ICD-10-CM

## 2022-09-30 DIAGNOSIS — F331 Major depressive disorder, recurrent, moderate: Secondary | ICD-10-CM | POA: Diagnosis not present

## 2022-09-30 DIAGNOSIS — F411 Generalized anxiety disorder: Secondary | ICD-10-CM | POA: Diagnosis not present

## 2022-09-30 MED ORDER — TRAZODONE HCL 100 MG PO TABS: 200.0000 mg | ORAL_TABLET | Freq: Every day | ORAL | 3 refills | Status: DC

## 2022-09-30 MED ORDER — ROSUVASTATIN CALCIUM 10 MG PO TABS: 10.0000 mg | ORAL_TABLET | Freq: Every day | ORAL | 2 refills | Status: DC

## 2022-09-30 NOTE — Progress Notes (Signed)
Subjective:  Patient ID: Kyle Arellano, male    DOB: Oct 26, 1955  Age: 67 y.o. MRN: [id number]  CC: Medical Management of Chronic Issues   HPI [name] presents for  in for follow-up of elevated cholesterol. Doing well without complaints on current medication. Denies side effects of statin including myalgia and arthralgia and nausea. Currently no chest pain, shortness of breath or other cardiovascular related symptoms noted.  Colonoscopy found 1 polyp recently. F/U  7 yrs. Recommended.      09/30/2022   11:23 AM 09/30/2022   11:16 AM 06/01/2022    8:54 AM  Depression screen PHQ 2/9  Decreased Interest 0 0 0  Down, Depressed, Hopeless 0 0 1  PHQ - 2 Score 0 0 1  Altered sleeping 1  1  Tired, decreased energy 1  0  Change in appetite 0  1  Feeling bad or failure about yourself  0  0  Trouble concentrating 0  0  Moving slowly or fidgety/restless 3  1  Suicidal thoughts 0  0  PHQ-9 Score 5  4  Difficult doing work/chores Not difficult at all  Not difficult at all      09/30/2022   11:24 AM 06/01/2022    8:54 AM 03/31/2022   11:28 AM 09/24/2021    3:33 PM  GAD 7 : Generalized Anxiety Score  Nervous, Anxious, on Edge 2 1 1 3   Control/stop worrying 3 1 1 3   Worry too much - different things 3 1 1 3   Trouble relaxing 0 0 1 3  Restless 0 0 1 3  Easily annoyed or irritable 1 1 1 3   Afraid - awful might happen 3 1 1 3   Total GAD 7 Score 12 5 7 21   Anxiety Difficulty Not difficult at all Not difficult at all Not difficult at all Not difficult at all     History Kyle Arellano has a past medical history of Depression, Hyperlipidemia, and Schizophrenia.   He has a past surgical history that includes Right foot surgery; Colonoscopy; Colonoscopy with propofol (N/A, 07/03/2022); and polypectomy (07/03/2022).   His family history includes Cancer in his father; Cirrhosis in his father; Diabetes in his father; Hypertension in his mother.He reports that he has quit smoking. He has never  used smokeless tobacco. He reports that he does not drink alcohol and does not use drugs.    ROS Review of Systems  Constitutional:  Negative for fever.  Respiratory:  Negative for shortness of breath.   Cardiovascular:  Negative for chest pain.  Musculoskeletal:  Negative for arthralgias.  Skin:  Negative for rash.    Objective:  BP 119/74   Pulse 72   Temp 98 F (36.7 C)   Ht 5' 7.5" (1.715 m)   Wt 222 lb 3.2 oz (100.8 kg)   SpO2 95%   BMI 34.29 kg/m   BP Readings from Last 3 Encounters:  09/30/22 119/74  07/03/22 128/79  06/01/22 124/79    Wt Readings from Last 3 Encounters:  09/30/22 222 lb 3.2 oz (100.8 kg)  07/03/22 227 lb (103 kg)  06/01/22 227 lb (103 kg)     Physical Exam Vitals reviewed.  Constitutional:      Appearance: He is well-developed.  HENT:     Head: Normocephalic and atraumatic.     Right Ear: External ear normal.     Left Ear: External ear normal.     Mouth/Throat:     Pharynx: No oropharyngeal exudate or posterior  oropharyngeal erythema.  Eyes:     Pupils: Pupils are equal, round, and reactive to light.  Cardiovascular:     Rate and Rhythm: Normal rate and regular rhythm.     Heart sounds: No murmur heard. Pulmonary:     Effort: No respiratory distress.     Breath sounds: Normal breath sounds.  Musculoskeletal:     Cervical back: Normal range of motion and neck supple.  Neurological:     Mental Status: He is alert and oriented to person, place, and time.       Assessment & Plan:   Lonell was seen today for medical management of chronic issues.  Diagnoses and all orders for this visit:  Mixed hyperlipidemia -     CBC with Differential/Platelet -     CMP14+EGFR -     Lipid panel  Anxiety state  Moderate episode of recurrent major depressive disorder  Other insomnia  Benign neoplasm of colon, unspecified part of colon  Other orders -     rosuvastatin (CRESTOR) 10 MG tablet; Take 1 tablet (10 mg total) by mouth  daily. -     traZODone (DESYREL) 100 MG tablet; Take 2 tablets (200 mg total) by mouth at bedtime. for sleep.       I am having Takai O. Boulter maintain his benztropine, Trintellix, buPROPion, fluPHENAZine, aspirin EC, Cholecalciferol (VITAMIN D-3 PO), multivitamin with minerals, ibuprofen, rosuvastatin, and traZODone.  Allergies as of 09/30/2022   No Known Allergies      Medication List        Accurate as of September 30, 2022  9:13 PM. If you have any questions, ask your nurse or doctor.          aspirin EC 81 MG tablet Take 81 mg by mouth in the morning. Swallow whole.   benztropine 2 MG tablet Commonly known as: COGENTIN Take 2 mg by mouth every other day. At bedtime.   buPROPion 150 MG 12 hr tablet Commonly known as: WELLBUTRIN SR Take 150-300 mg by mouth See admin instructions. Take 2 tablets (300 mg) by mouth in the morning & take 1 tablet (150 mg) by mouth at night.   fluPHENAZine 10 MG tablet Commonly known as: PROLIXIN Take 10 mg by mouth every morning.   ibuprofen 200 MG tablet Commonly known as: ADVIL Take 400 mg by mouth every 8 (eight) hours as needed (headaches/pain.).   multivitamin with minerals Tabs tablet Take 1 tablet by mouth in the morning.   rosuvastatin 10 MG tablet Commonly known as: CRESTOR Take 1 tablet (10 mg total) by mouth daily.   traZODone 100 MG tablet Commonly known as: DESYREL Take 2 tablets (200 mg total) by mouth at bedtime. for sleep.   Trintellix 20 MG Tabs tablet Generic drug: vortioxetine HBr Take 20 mg by mouth every morning.   VITAMIN D-3 PO Take 1 tablet by mouth in the morning.         Follow-up: No follow-ups on file.  Mechele Claude, M.D.

## 2022-10-01 LAB — LIPID PANEL
Chol/HDL Ratio: 3.9 ratio (ref 0.0–5.0)
Cholesterol, Total: 131 mg/dL (ref 100–199)
HDL: 34 mg/dL — ABNORMAL LOW (ref >39)
LDL Chol Calc (NIH): 60 mg/dL (ref 0–99)
Triglycerides: 229 mg/dL — ABNORMAL HIGH (ref 0–149)
VLDL Cholesterol Cal: 37 mg/dL (ref 5–40)

## 2022-10-01 LAB — CMP14+EGFR
ALT: 30 IU/L (ref 0–44)
AST: 21 IU/L (ref 0–40)
Albumin/Globulin Ratio: 2.2 (ref 1.2–2.2)
Albumin: 4.3 g/dL (ref 3.9–4.9)
Alkaline Phosphatase: 77 IU/L (ref 44–121)
BUN/Creatinine Ratio: 13 (ref 10–24)
BUN: 16 mg/dL (ref 8–27)
Bilirubin Total: 0.4 mg/dL (ref 0.0–1.2)
CO2: 22 mmol/L (ref 20–29)
Calcium: 9.3 mg/dL (ref 8.6–10.2)
Chloride: 104 mmol/L (ref 96–106)
Creatinine, Ser: 1.27 mg/dL (ref 0.76–1.27)
Globulin, Total: 2 g/dL (ref 1.5–4.5)
Glucose: 110 mg/dL — ABNORMAL HIGH (ref 70–99)
Potassium: 4.8 mmol/L (ref 3.5–5.2)
Sodium: 141 mmol/L (ref 134–144)
Total Protein: 6.3 g/dL (ref 6.0–8.5)
eGFR: 62 mL/min/{1.73_m2} (ref >59)

## 2022-10-01 LAB — CBC WITH DIFFERENTIAL/PLATELET
Basophils Absolute: 0.1 10*3/uL (ref 0.0–0.2)
Basos: 2 %
EOS (ABSOLUTE): 0.2 10*3/uL (ref 0.0–0.4)
Eos: 3 %
Hematocrit: 49.4 % (ref 37.5–51.0)
Hemoglobin: 16.6 g/dL (ref 13.0–17.7)
Immature Grans (Abs): 0 10*3/uL (ref 0.0–0.1)
Immature Granulocytes: 1 %
Lymphocytes Absolute: 1 10*3/uL (ref 0.7–3.1)
Lymphs: 15 %
MCH: 30 pg (ref 26.6–33.0)
MCHC: 33.6 g/dL (ref 31.5–35.7)
MCV: 89 fL (ref 79–97)
Monocytes Absolute: 0.5 10*3/uL (ref 0.1–0.9)
Monocytes: 8 %
Neutrophils Absolute: 4.6 10*3/uL (ref 1.4–7.0)
Neutrophils: 71 %
Platelets: 224 10*3/uL (ref 150–450)
RBC: 5.54 x10E6/uL (ref 4.14–5.80)
RDW: 13.7 % (ref 11.6–15.4)
WBC: 6.4 10*3/uL (ref 3.4–10.8)

## 2022-10-06 NOTE — Progress Notes (Signed)
Hello Excell,  Your lab result is normal and/or stable.Some minor variations that are not significant are commonly marked abnormal, but do not represent any medical problem for you.  Best regards, Kaydie Petsch, M.D.

## 2022-11-30 ENCOUNTER — Ambulatory Visit (INDEPENDENT_AMBULATORY_CARE_PROVIDER_SITE_OTHER): Payer: Medicare Other | Admitting: Family Medicine

## 2022-11-30 ENCOUNTER — Ambulatory Visit (INDEPENDENT_AMBULATORY_CARE_PROVIDER_SITE_OTHER): Payer: Medicare Other

## 2022-11-30 ENCOUNTER — Encounter: Payer: Self-pay | Admitting: Family Medicine

## 2022-11-30 VITALS — BP 143/85 | HR 91 | Temp 97.6°F | Ht 67.5 in | Wt 225.8 lb

## 2022-11-30 DIAGNOSIS — R0781 Pleurodynia: Secondary | ICD-10-CM

## 2022-11-30 DIAGNOSIS — R0789 Other chest pain: Secondary | ICD-10-CM | POA: Diagnosis not present

## 2022-11-30 MED ORDER — DICLOFENAC SODIUM 75 MG PO TBEC: 75.0000 mg | DELAYED_RELEASE_TABLET | Freq: Two times a day (BID) | ORAL | 0 refills | Status: AC

## 2022-11-30 MED ORDER — ACETAMINOPHEN-CODEINE 300-30 MG PO TABS: 1.0000 | ORAL_TABLET | ORAL | 0 refills | Status: AC | PRN

## 2022-11-30 NOTE — Progress Notes (Addendum)
Subjective:  Patient ID: Kyle Arellano, male    DOB: June 30, 1955  Age: 67 y.o. MRN: [id number]  CC: Fall and Rib Injury (Right side//)   HPI Kyle Arellano presents for falling multiple times. Kyle Arellano a week ago on right side. Tripped when foot caught on a rocking chair. Pain has been getting worse. Was awake all night with pain last night.   Also fell on May 12. Fell face first on slick pavement in the rain and hit face first. Has been seeing double intermittently since then. No LOC. Felt foggy, shaky after for a short while.     11/30/2022    8:28 AM 11/30/2022    8:17 AM 09/30/2022   11:23 AM  Depression screen PHQ 2/9  Decreased Interest 0 0 0  Down, Depressed, Hopeless 1 0 0  PHQ - 2 Score 1 0 0  Altered sleeping 2  1  Tired, decreased energy 1  1  Change in appetite 2  0  Feeling bad or failure about yourself  0  0  Trouble concentrating 1  0  Moving slowly or fidgety/restless 3  3  Suicidal thoughts 0  0  PHQ-9 Score 10  5  Difficult doing work/chores Not difficult at all  Not difficult at all    History Tirso has a past medical history of Depression, Hyperlipidemia, and Schizophrenia (HCC).   He has a past surgical history that includes Right foot surgery; Colonoscopy; Colonoscopy with propofol (N/A, 07/03/2022); and polypectomy (07/03/2022).   His family history includes Cancer in his father; Cirrhosis in his father; Diabetes in his father; Hypertension in his mother.He reports that he has quit smoking. He has never used smokeless tobacco. He reports that he does not drink alcohol and does not use drugs.    ROS Review of Systems  Constitutional:  Negative for fever.  Respiratory:  Negative for shortness of breath (but it hurts to take a deep breath.).   Cardiovascular:  Negative for chest pain.  Musculoskeletal:  Negative for arthralgias.  Skin:  Negative for rash.    Objective:  BP (!) 143/85   Pulse 91   Temp 97.6 F (36.4 C)   Ht 5' 7.5" (1.715 m)   Wt 225  lb 12.8 oz (102.4 kg)   SpO2 98%   BMI 34.84 kg/m   BP Readings from Last 3 Encounters:  11/30/22 (!) 143/85  09/30/22 119/74  07/03/22 128/79    Wt Readings from Last 3 Encounters:  11/30/22 225 lb 12.8 oz (102.4 kg)  09/30/22 222 lb 3.2 oz (100.8 kg)  07/03/22 227 lb (103 kg)     Physical Exam Vitals reviewed.  Constitutional:      Appearance: He is well-developed.  HENT:     Head: Normocephalic and atraumatic.     Right Ear: External ear normal.     Left Ear: External ear normal.     Mouth/Throat:     Pharynx: No oropharyngeal exudate or posterior oropharyngeal erythema.  Eyes:     Pupils: Pupils are equal, round, and reactive to light.  Cardiovascular:     Rate and Rhythm: Normal rate and regular rhythm.     Heart sounds: No murmur heard. Pulmonary:     Effort: No respiratory distress.     Breath sounds: Normal breath sounds.  Abdominal:     Tenderness: There is abdominal tenderness (at right costal margin at 9th rib anteriorly.).  Musculoskeletal:     Cervical back: Normal range of motion  and neck supple.  Neurological:     Mental Status: He is alert and oriented to person, place, and time.       Assessment & Plan:   Jacquise was seen today for fall and rib injury.  Diagnoses and all orders for this visit:  Rib pain on right side -     DG Ribs Unilateral W/Chest Right; Future  Other orders -     diclofenac (VOLTAREN) 75 MG EC tablet; Take 1 tablet (75 mg total) by mouth 2 (two) times daily for 14 days. For muscle and  Joint pain -     acetaminophen-codeine (TYLENOL #3) 300-30 MG tablet; Take 1-2 tablets by mouth every 4 (four) hours as needed for up to 5 days for moderate pain.       I have discontinued Bain O. Pooley "TIM"'s ibuprofen. I am also having him start on diclofenac and acetaminophen-codeine. Additionally, I am having him maintain his benztropine, Trintellix, buPROPion, fluPHENAZine, aspirin EC, Cholecalciferol (VITAMIN D-3 PO),  multivitamin with minerals, rosuvastatin, and traZODone.  Allergies as of 11/30/2022   No Known Allergies      Medication List        Accurate as of November 30, 2022  4:46 PM. If you have any questions, ask your nurse or doctor.          STOP taking these medications    ibuprofen 200 MG tablet Commonly known as: ADVIL Stopped by: Mechele Claude, MD       TAKE these medications    acetaminophen-codeine 300-30 MG tablet Commonly known as: TYLENOL #3 Take 1-2 tablets by mouth every 4 (four) hours as needed for up to 5 days for moderate pain. Started by: Mechele Claude, MD   aspirin EC 81 MG tablet Take 81 mg by mouth in the morning. Swallow whole.   benztropine 2 MG tablet Commonly known as: COGENTIN Take 2 mg by mouth every other day. At bedtime.   buPROPion 150 MG 12 hr tablet Commonly known as: WELLBUTRIN SR Take 150-300 mg by mouth See admin instructions. Take 2 tablets (300 mg) by mouth in the morning & take 1 tablet (150 mg) by mouth at night.   diclofenac 75 MG EC tablet Commonly known as: VOLTAREN Take 1 tablet (75 mg total) by mouth 2 (two) times daily for 14 days. For muscle and  Joint pain Started by: Mechele Claude, MD   fluPHENAZine 10 MG tablet Commonly known as: PROLIXIN Take 10 mg by mouth every morning.   multivitamin with minerals Tabs tablet Take 1 tablet by mouth in the morning.   rosuvastatin 10 MG tablet Commonly known as: CRESTOR Take 1 tablet (10 mg total) by mouth daily.   traZODone 100 MG tablet Commonly known as: DESYREL Take 2 tablets (200 mg total) by mouth at bedtime. for sleep.   Trintellix 20 MG Tabs tablet Generic drug: vortioxetine HBr Take 20 mg by mouth every morning.   VITAMIN D-3 PO Take 1 tablet by mouth in the morning.         Follow-up: Return in about 2 weeks (around 12/14/2022).  Mechele Claude, M.D.

## 2022-12-15 ENCOUNTER — Ambulatory Visit (INDEPENDENT_AMBULATORY_CARE_PROVIDER_SITE_OTHER): Payer: Medicare Other | Admitting: Family Medicine

## 2022-12-15 ENCOUNTER — Encounter: Payer: Self-pay | Admitting: Family Medicine

## 2022-12-15 VITALS — BP 102/68 | HR 69 | Temp 97.2°F | Ht 67.5 in | Wt 223.4 lb

## 2022-12-15 DIAGNOSIS — R739 Hyperglycemia, unspecified: Secondary | ICD-10-CM

## 2022-12-15 DIAGNOSIS — R2689 Other abnormalities of gait and mobility: Secondary | ICD-10-CM | POA: Insufficient documentation

## 2022-12-15 LAB — BAYER DCA HB A1C WAIVED: HB A1C (BAYER DCA - WAIVED): 6 % — ABNORMAL HIGH (ref 4.8–5.6)

## 2022-12-15 NOTE — Progress Notes (Signed)
Subjective:  Patient ID: Kyle Arellano, male    DOB: [birth year]  Age: 67 y.o. MRN: [id number]  CC: Follow-up (Multiple falls/)   HPI [name] presents for rain on the right side. Went away soon after last visit.  Still losing balance easily. He has not fallen. Reaches out to grab things if unsteady.   Concerned about glucose. Would like for it to be tested, but forgat and drank a glass of milk this AM.     12/15/2022    8:45 AM 12/15/2022    8:34 AM 11/30/2022    8:28 AM  Depression screen PHQ 2/9  Decreased Interest 0 0 0  Down, Depressed, Hopeless 1 0 1  PHQ - 2 Score 1 0 1  Altered sleeping 1  2  Tired, decreased energy 0  1  Change in appetite 0  2  Feeling bad or failure about yourself  0  0  Trouble concentrating 1  1  Moving slowly or fidgety/restless 1  3  Suicidal thoughts 0  0  PHQ-9 Score 4  10  Difficult doing work/chores Not difficult at all  Not difficult at all    History Kariem has a past medical history of Depression, Hyperlipidemia, and Schizophrenia (HCC).   He has a past surgical history that includes Right foot surgery; Colonoscopy; Colonoscopy with propofol (N/A, 07/03/2022); and polypectomy (07/03/2022).   His family history includes Cancer in his father; Cirrhosis in his father; Diabetes in his father; Hypertension in his mother.He reports that he has quit smoking. He has never used smokeless tobacco. He reports that he does not drink alcohol and does not use drugs.    ROS Review of Systems  Constitutional:  Negative for fever.  Respiratory:  Positive for stridor. Negative for shortness of breath.   Cardiovascular:  Negative for chest pain.  Gastrointestinal:  Negative for abdominal pain and nausea.  Endocrine: Negative for polydipsia, polyphagia and polyuria.  Musculoskeletal:  Negative for arthralgias.  Skin:  Negative for rash.  Psychiatric/Behavioral:  Positive for sleep disturbance (much better with trazodone. No hangover).      Objective:  BP 102/68   Pulse 69   Temp (!) 97.2 F (36.2 C)   Ht 5' 7.5" (1.715 m)   Wt 223 lb 6.4 oz (101.3 kg)   SpO2 96%   BMI 34.47 kg/m   BP Readings from Last 3 Encounters:  12/15/22 102/68  11/30/22 (!) 143/85  09/30/22 119/74    Wt Readings from Last 3 Encounters:  12/15/22 223 lb 6.4 oz (101.3 kg)  11/30/22 225 lb 12.8 oz (102.4 kg)  09/30/22 222 lb 3.2 oz (100.8 kg)     Physical Exam Vitals reviewed.  Constitutional:      Appearance: He is well-developed.  HENT:     Head: Normocephalic and atraumatic.     Right Ear: External ear normal.     Left Ear: External ear normal.     Mouth/Throat:     Pharynx: No oropharyngeal exudate or posterior oropharyngeal erythema.  Eyes:     Pupils: Pupils are equal, round, and reactive to light.  Cardiovascular:     Rate and Rhythm: Normal rate and regular rhythm.     Heart sounds: No murmur heard. Pulmonary:     Effort: No respiratory distress.     Breath sounds: Normal breath sounds.  Musculoskeletal:     Cervical back: Normal range of motion and neck supple.  Neurological:     Mental Status: He  is alert and oriented to person, place, and time.       Assessment & Plan:   Hermen was seen today for follow-up.  Diagnoses and all orders for this visit:  Impairment of balance  Elevated serum glucose -     Bayer DCA Hb A1c Waived -     BMP8+EGFR    Pt. Agrees to always walk with a cane. He will check with his psychiatrist regarding possiblity of unsteadiness with current meds. I msy have to take him off of trazodone, but that seems to be working well to help him sleep and causing no morning hangover.    I am having Eisa O. Baris "TIM" maintain his benztropine, Trintellix, buPROPion, fluPHENAZine, aspirin EC, Cholecalciferol (VITAMIN D-3 PO), multivitamin with minerals, rosuvastatin, and traZODone.  Allergies as of 12/15/2022   No Known Allergies      Medication List        Accurate as of  December 15, 2022  9:55 AM. If you have any questions, ask your nurse or doctor.          aspirin EC 81 MG tablet Take 81 mg by mouth in the morning. Swallow whole.   benztropine 2 MG tablet Commonly known as: COGENTIN Take 2 mg by mouth every other day. At bedtime.   buPROPion 150 MG 12 hr tablet Commonly known as: WELLBUTRIN SR Take 150-300 mg by mouth See admin instructions. Take 2 tablets (300 mg) by mouth in the morning & take 1 tablet (150 mg) by mouth at night.   fluPHENAZine 10 MG tablet Commonly known as: PROLIXIN Take 10 mg by mouth every morning.   multivitamin with minerals Tabs tablet Take 1 tablet by mouth in the morning.   rosuvastatin 10 MG tablet Commonly known as: CRESTOR Take 1 tablet (10 mg total) by mouth daily.   traZODone 100 MG tablet Commonly known as: DESYREL Take 2 tablets (200 mg total) by mouth at bedtime. for sleep.   Trintellix 20 MG Tabs tablet Generic drug: vortioxetine HBr Take 20 mg by mouth every morning.   VITAMIN D-3 PO Take 1 tablet by mouth in the morning.         Follow-up: Return in about 3 months (around 03/17/2023).  Mechele Claude, M.D.

## 2022-12-16 LAB — BMP8+EGFR
BUN/Creatinine Ratio: 17 (ref 10–24)
BUN: 22 mg/dL (ref 8–27)
CO2: 26 mmol/L (ref 20–29)
Calcium: 9.4 mg/dL (ref 8.6–10.2)
Chloride: 101 mmol/L (ref 96–106)
Creatinine, Ser: 1.26 mg/dL (ref 0.76–1.27)
Glucose: 120 mg/dL — ABNORMAL HIGH (ref 70–99)
Potassium: 4.6 mmol/L (ref 3.5–5.2)
Sodium: 140 mmol/L (ref 134–144)
eGFR: 63 mL/min/{1.73_m2} (ref >59)

## 2022-12-16 NOTE — Progress Notes (Signed)
Hello Kyle Arellano,  Your lab result is normal and/or stable.Some minor variations that are not significant are commonly marked abnormal, but do not represent any medical problem for you.  Best regards, Zulay Corrie, M.D.

## 2023-02-02 ENCOUNTER — Encounter: Payer: Self-pay | Admitting: *Deleted

## 2023-03-17 ENCOUNTER — Ambulatory Visit: Payer: Medicare Other | Admitting: Family Medicine

## 2023-03-29 ENCOUNTER — Ambulatory Visit (INDEPENDENT_AMBULATORY_CARE_PROVIDER_SITE_OTHER): Payer: Medicare Other | Admitting: Family Medicine

## 2023-03-29 ENCOUNTER — Encounter: Payer: Self-pay | Admitting: Family Medicine

## 2023-03-29 VITALS — BP 112/78 | HR 71 | Temp 97.3°F | Ht 67.5 in | Wt 224.2 lb

## 2023-03-29 DIAGNOSIS — E782 Mixed hyperlipidemia: Secondary | ICD-10-CM

## 2023-03-29 DIAGNOSIS — R7303 Prediabetes: Secondary | ICD-10-CM

## 2023-03-29 LAB — BAYER DCA HB A1C WAIVED: HB A1C (BAYER DCA - WAIVED): 6 % — ABNORMAL HIGH (ref 4.8–5.6)

## 2023-03-29 NOTE — Progress Notes (Signed)
Subjective:  Patient ID: Kyle Arellano, male    DOB: 1955-10-03  Age: 67 y.o. MRN: [id number]  CC: Medical Management of Chronic Issues  prediabetes HPI Kyle Arellano presents for recheck of A1c for prediabetes. Kyle Arellano is exercising regularly and avoiding carbs.    in for follow-up of elevated cholesterol. Doing well without complaints on current medication. Denies side effects of statin including myalgia and arthralgia and nausea. Currently no chest pain, shortness of breath or other cardiovascular related symptoms noted.      03/29/2023   11:18 AM 03/29/2023   11:04 AM 12/15/2022    8:45 AM  Depression screen PHQ 2/9  Decreased Interest 0 0 0  Down, Depressed, Hopeless 1 0 1  PHQ - 2 Score 1 0 1  Altered sleeping 0  1  Tired, decreased energy 1  0  Change in appetite 0  0  Feeling bad or failure about yourself  0  0  Trouble concentrating 1  1  Moving slowly or fidgety/restless 1  1  Suicidal thoughts 0  0  PHQ-9 Score 4  4  Difficult doing work/chores Not difficult at all  Not difficult at all    History Kyle Arellano has a past medical history of Depression, Hyperlipidemia, and Schizophrenia (HCC).   Kyle Arellano has a past surgical history that includes Right foot surgery; Colonoscopy; Colonoscopy with propofol (N/A, 07/03/2022); and polypectomy (07/03/2022).   His family history includes Cancer in his father; Cirrhosis in his father; Diabetes in his father; Hypertension in his mother.Kyle Arellano reports that Kyle Arellano has quit smoking. Kyle Arellano has never used smokeless tobacco. Kyle Arellano reports that Kyle Arellano does not drink alcohol and does not use drugs.    ROS Review of Systems  Constitutional:  Negative for fever.  Respiratory:  Negative for shortness of breath.   Cardiovascular:  Negative for chest pain.  Musculoskeletal:  Negative for arthralgias.  Skin:  Negative for rash.    Objective:  BP 112/78   Pulse 71   Temp (!) 97.3 F (36.3 C)   Ht 5' 7.5" (1.715 m)   Wt 224 lb 3.2 oz (101.7 kg)   SpO2 93%    BMI 34.60 kg/m   BP Readings from Last 3 Encounters:  03/29/23 112/78  12/15/22 102/68  11/30/22 (!) 143/85    Wt Readings from Last 3 Encounters:  03/29/23 224 lb 3.2 oz (101.7 kg)  12/15/22 223 lb 6.4 oz (101.3 kg)  11/30/22 225 lb 12.8 oz (102.4 kg)     Physical Exam Vitals reviewed.  Constitutional:      Appearance: Kyle Arellano is well-developed.  HENT:     Head: Normocephalic and atraumatic.     Right Ear: External ear normal.     Left Ear: External ear normal.     Mouth/Throat:     Pharynx: No oropharyngeal exudate or posterior oropharyngeal erythema.  Eyes:     Pupils: Pupils are equal, round, and reactive to light.  Cardiovascular:     Rate and Rhythm: Normal rate and regular rhythm.     Heart sounds: No murmur heard. Pulmonary:     Effort: No respiratory distress.     Breath sounds: Normal breath sounds.  Musculoskeletal:     Cervical back: Normal range of motion and neck supple.  Neurological:     Mental Status: Kyle Arellano is alert and oriented to person, place, and time.      Assessment & Plan:   Kyle "TIM" was seen today for medical management of chronic issues.  Diagnoses and all orders for this visit:  Mixed hyperlipidemia -     Lipid panel  Pre-diabetes -     Bayer DCA Hb A1c Waived -     CBC with Differential/Platelet -     CMP14+EGFR       I am having Kyle Arellano "TIM" maintain his benztropine, Trintellix, buPROPion, fluPHENAZine, aspirin EC, Cholecalciferol (VITAMIN D-3 PO), multivitamin with minerals, rosuvastatin, and traZODone.  Allergies as of 03/29/2023   No Known Allergies      Medication List        Accurate as of March 29, 2023 11:31 AM. If you have any questions, ask your nurse or doctor.          aspirin EC 81 MG tablet Take 81 mg by mouth in the morning. Swallow whole.   benztropine 2 MG tablet Commonly known as: COGENTIN Take 2 mg by mouth every other day. At bedtime.   buPROPion 150 MG 12 hr tablet Commonly  known as: WELLBUTRIN SR Take 150-300 mg by mouth See admin instructions. Take 2 tablets (300 mg) by mouth in the morning & take 1 tablet (150 mg) by mouth at night.   fluPHENAZine 10 MG tablet Commonly known as: PROLIXIN Take 10 mg by mouth every morning.   multivitamin with minerals Tabs tablet Take 1 tablet by mouth in the morning.   rosuvastatin 10 MG tablet Commonly known as: CRESTOR Take 1 tablet (10 mg total) by mouth daily.   traZODone 100 MG tablet Commonly known as: DESYREL Take 2 tablets (200 mg total) by mouth at bedtime. for sleep.   Trintellix 20 MG Tabs tablet Generic drug: vortioxetine HBr Take 20 mg by mouth every morning.   VITAMIN D-3 PO Take 1 tablet by mouth in the morning.         Follow-up: Return in about 6 months (around 09/27/2023).  Mechele Claude, M.D.

## 2023-03-30 LAB — CBC WITH DIFFERENTIAL/PLATELET
Basophils Absolute: 0.1 10*3/uL (ref 0.0–0.2)
Basos: 2 %
EOS (ABSOLUTE): 0.1 10*3/uL (ref 0.0–0.4)
Eos: 2 %
Hematocrit: 49 % (ref 37.5–51.0)
Hemoglobin: 16.3 g/dL (ref 13.0–17.7)
Immature Grans (Abs): 0 10*3/uL (ref 0.0–0.1)
Immature Granulocytes: 0 %
Lymphocytes Absolute: 1 10*3/uL (ref 0.7–3.1)
Lymphs: 18 %
MCH: 30.2 pg (ref 26.6–33.0)
MCHC: 33.3 g/dL (ref 31.5–35.7)
MCV: 91 fL (ref 79–97)
Monocytes Absolute: 0.4 10*3/uL (ref 0.1–0.9)
Monocytes: 8 %
Neutrophils Absolute: 4.1 10*3/uL (ref 1.4–7.0)
Neutrophils: 70 %
Platelets: 228 10*3/uL (ref 150–450)
RBC: 5.39 x10E6/uL (ref 4.14–5.80)
RDW: 13.8 % (ref 11.6–15.4)
WBC: 5.8 10*3/uL (ref 3.4–10.8)

## 2023-03-30 LAB — CMP14+EGFR
ALT: 27 [IU]/L (ref 0–44)
AST: 21 [IU]/L (ref 0–40)
Albumin: 4.1 g/dL (ref 3.9–4.9)
Alkaline Phosphatase: 86 [IU]/L (ref 44–121)
BUN/Creatinine Ratio: 16 (ref 10–24)
BUN: 21 mg/dL (ref 8–27)
Bilirubin Total: 0.4 mg/dL (ref 0.0–1.2)
CO2: 24 mmol/L (ref 20–29)
Calcium: 9.1 mg/dL (ref 8.6–10.2)
Chloride: 105 mmol/L (ref 96–106)
Creatinine, Ser: 1.33 mg/dL — ABNORMAL HIGH (ref 0.76–1.27)
Globulin, Total: 2.2 g/dL (ref 1.5–4.5)
Glucose: 116 mg/dL — ABNORMAL HIGH (ref 70–99)
Potassium: 4.7 mmol/L (ref 3.5–5.2)
Sodium: 142 mmol/L (ref 134–144)
Total Protein: 6.3 g/dL (ref 6.0–8.5)
eGFR: 59 mL/min/{1.73_m2} — ABNORMAL LOW (ref >59)

## 2023-03-30 LAB — LIPID PANEL
Chol/HDL Ratio: 3.9 {ratio} (ref 0.0–5.0)
Cholesterol, Total: 132 mg/dL (ref 100–199)
HDL: 34 mg/dL — ABNORMAL LOW (ref >39)
LDL Chol Calc (NIH): 64 mg/dL (ref 0–99)
Triglycerides: 208 mg/dL — ABNORMAL HIGH (ref 0–149)
VLDL Cholesterol Cal: 34 mg/dL (ref 5–40)

## 2023-03-31 NOTE — Progress Notes (Signed)
Hello Loring,  Your lab result is normal and/or stable.Some minor variations that are not significant are commonly marked abnormal, but do not represent any medical problem for you.  Best regards, Katelynn Heidler, M.D.

## 2023-04-01 ENCOUNTER — Ambulatory Visit: Payer: Medicare Other | Admitting: Family Medicine

## 2023-05-14 ENCOUNTER — Telehealth: Payer: Self-pay | Admitting: Family Medicine

## 2023-05-14 NOTE — Telephone Encounter (Signed)
Received call from Rosey Bath at [facility] stating that she had [facility] on the other line who were requesting pts ROSUVASTATIN refills be sent to them.   Explained to Rosey Bath that he has refills at his local pharmacy that will last him until April 2025 and that I would call pt to confirm if he wants his refills transferred to [facility].  Tried calling pt to confirm this but NO ANSWER and NO VOICEMAIL OPTION. If patient calls back, please ask him if he wants to keep his refills at his local pharmacy ([facility]) or transfer to mail order pharmacy ([facility]).

## 2023-09-10 ENCOUNTER — Other Ambulatory Visit: Payer: Self-pay | Admitting: Family Medicine

## 2023-09-30 ENCOUNTER — Encounter: Payer: Self-pay | Admitting: Family Medicine

## 2023-09-30 ENCOUNTER — Other Ambulatory Visit

## 2023-09-30 ENCOUNTER — Ambulatory Visit (INDEPENDENT_AMBULATORY_CARE_PROVIDER_SITE_OTHER): Payer: Medicare Other | Admitting: Family Medicine

## 2023-09-30 VITALS — BP 106/70 | HR 65 | Temp 98.4°F | Ht 67.5 in | Wt 219.0 lb

## 2023-09-30 DIAGNOSIS — H8113 Benign paroxysmal vertigo, bilateral: Secondary | ICD-10-CM

## 2023-09-30 DIAGNOSIS — F331 Major depressive disorder, recurrent, moderate: Secondary | ICD-10-CM

## 2023-09-30 DIAGNOSIS — E782 Mixed hyperlipidemia: Secondary | ICD-10-CM | POA: Diagnosis not present

## 2023-09-30 DIAGNOSIS — R739 Hyperglycemia, unspecified: Secondary | ICD-10-CM

## 2023-09-30 DIAGNOSIS — R7303 Prediabetes: Secondary | ICD-10-CM | POA: Insufficient documentation

## 2023-09-30 DIAGNOSIS — H6123 Impacted cerumen, bilateral: Secondary | ICD-10-CM | POA: Diagnosis not present

## 2023-09-30 DIAGNOSIS — G4709 Other insomnia: Secondary | ICD-10-CM | POA: Diagnosis not present

## 2023-09-30 DIAGNOSIS — R6889 Other general symptoms and signs: Secondary | ICD-10-CM | POA: Diagnosis not present

## 2023-09-30 LAB — BAYER DCA HB A1C WAIVED: HB A1C (BAYER DCA - WAIVED): 6.1 % — ABNORMAL HIGH (ref 4.8–5.6)

## 2023-09-30 MED ORDER — TRAZODONE HCL 100 MG PO TABS: 200.0000 mg | ORAL_TABLET | Freq: Every day | ORAL | 3 refills | Status: AC

## 2023-09-30 MED ORDER — ROSUVASTATIN CALCIUM 10 MG PO TABS: 10.0000 mg | ORAL_TABLET | Freq: Every day | ORAL | 0 refills | Status: DC

## 2023-09-30 NOTE — Progress Notes (Signed)
 Subjective:  Patient ID: Kyle Arellano, male    DOB: [birth year]  Age: 68 y.o. MRN: [id number]  CC: Medical Management of Chronic Issues (No concerns at this time. )   HPI Kyle Arellano presents for follow-up of elevated cholesterol. Doing well without complaints on current medication. Denies side effects of statin including myalgia and arthralgia and nausea. Also in today for liver function testing. Currently no chest pain, shortness of breath or other cardiovascular related symptoms noted.  Sees psych. Kyle Arellano  Has dizziness upon awakening and when first laying down at night. Lasts for 1 minute. Room moving in a circle. OnSet over 1 month ago. No nausea  History Kyle Arellano has a past medical history of Depression, Hyperlipidemia, and Schizophrenia (HCC).   Kyle Arellano has a past surgical history that includes Right foot surgery; Colonoscopy; Colonoscopy with propofol (N/A, 07/03/2022); and polypectomy (07/03/2022).   His family history includes Cancer in his father; Cirrhosis in his father; Diabetes in his father; Hypertension in his mother.Kyle Arellano reports that Kyle Arellano has quit smoking. Kyle Arellano has never used smokeless tobacco. Kyle Arellano reports that Kyle Arellano does not drink alcohol and does not use drugs.  Current Outpatient Medications on File Prior to Visit  Medication Sig Dispense Refill   aspirin EC 81 MG tablet Take 81 mg by mouth in the morning. Swallow whole.     benztropine (COGENTIN) 2 MG tablet Take 2 mg by mouth every other day. At bedtime.     buPROPion (WELLBUTRIN SR) 150 MG 12 hr tablet Take 150-300 mg by mouth See admin instructions. Take 2 tablets (300 mg) by mouth in the morning & take 1 tablet (150 mg) by mouth at night.     Cholecalciferol (VITAMIN D-3 PO) Take 1 tablet by mouth in the morning.     fluPHENAZine (PROLIXIN) 10 MG tablet Take 10 mg by mouth every morning.     Multiple Vitamin (MULTIVITAMIN WITH MINERALS) TABS tablet Take 1 tablet by mouth in the morning.     TRINTELLIX 20 MG TABS tablet  Take 20 mg by mouth every morning.     No current facility-administered medications on file prior to visit.    ROS Review of Systems  Constitutional:  Negative for fever.  Respiratory:  Negative for shortness of breath.   Cardiovascular:  Negative for chest pain.  Musculoskeletal:  Negative for arthralgias.  Skin:  Negative for rash.    Objective:  BP 106/70   Pulse 65   Temp 98.4 F (36.9 C)   Ht 5' 7.5" (1.715 m)   Wt 219 lb (99.3 kg)   SpO2 95%   BMI 33.79 kg/m   BP Readings from Last 3 Encounters:  09/30/23 106/70  03/29/23 112/78  12/15/22 102/68    Wt Readings from Last 3 Encounters:  09/30/23 219 lb (99.3 kg)  03/29/23 224 lb 3.2 oz (101.7 kg)  12/15/22 223 lb 6.4 oz (101.3 kg)     Physical Exam Vitals reviewed.  Constitutional:      Appearance: Kyle Arellano is well-developed.  HENT:     Head: Normocephalic and atraumatic.     Right Ear: External ear normal. There is impacted cerumen.     Left Ear: External ear normal. There is no impacted cerumen.     Mouth/Throat:     Pharynx: No oropharyngeal exudate or posterior oropharyngeal erythema.  Eyes:     Pupils: Pupils are equal, round, and reactive to light.  Cardiovascular:     Rate and Rhythm: Normal rate and  regular rhythm.     Heart sounds: No murmur heard. Pulmonary:     Effort: No respiratory distress.     Breath sounds: Normal breath sounds.  Musculoskeletal:     Cervical back: Normal range of motion and neck supple.  Neurological:     Mental Status: Kyle Arellano is alert and oriented to person, place, and time.   Ear lavage performed bilateral releasing lg amount of cerumen from canals.  Lab Results  Component Value Date   HGBA1C 6.0 (H) 03/29/2023   HGBA1C 6.0 (H) 12/15/2022   HGBA1C 5.4 09/02/2020    Lab Results  Component Value Date   WBC 5.8 03/29/2023   HGB 16.3 03/29/2023   HCT 49.0 03/29/2023   PLT 228 03/29/2023   GLUCOSE 116 (H) 03/29/2023   CHOL 132 03/29/2023   TRIG 208 (H) 03/29/2023    HDL 34 (L) 03/29/2023   LDLDIRECT 130 (H) 09/02/2020   LDLCALC 64 03/29/2023   ALT 27 03/29/2023   AST 21 03/29/2023   NA 142 03/29/2023   K 4.7 03/29/2023   CL 105 03/29/2023   CREATININE 1.33 (H) 03/29/2023   BUN 21 03/29/2023   CO2 24 03/29/2023   TSH 2.020 03/31/2022   INR 1.0 09/20/2021   HGBA1C 6.0 (H) 03/29/2023    No results found.  Assessment & Plan:  Pre-diabetes -     Bayer DCA Hb A1c Waived -     CBC with Differential/Platelet -     CMP14+EGFR  Mixed hyperlipidemia -     Lipid panel  Other insomnia -     TSH + free T4 -     VITAMIN D 25 Hydroxy (Vit-D Deficiency, Fractures)  Moderate episode of recurrent major depressive disorder (HCC) -     TSH + free T4 -     VITAMIN D 25 Hydroxy (Vit-D Deficiency, Fractures)  Benign paroxysmal positional vertigo due to bilateral vestibular disorder  Bilateral impacted cerumen  Other orders -     traZODone HCl; Take 2 tablets (200 mg total) by mouth at bedtime. for sleep.  Dispense: 180 tablet; Refill: 3 -     Rosuvastatin Calcium; Take 1 tablet (10 mg total) by mouth daily.  Dispense: 100 tablet; Refill: 0    I have changed Kyle Arellano "Kyle Arellano"'s rosuvastatin. I am also having him maintain his benztropine, Trintellix, buPROPion, fluPHENAZine, aspirin EC, Cholecalciferol (VITAMIN D-3 PO), multivitamin with minerals, and traZODone.    Follow-up: Return in about 6 months (around 03/31/2024).  Kyle Arellano, M.D.

## 2023-10-01 LAB — CBC WITH DIFFERENTIAL/PLATELET
Basophils Absolute: 0.1 10*3/uL (ref 0.0–0.2)
Basos: 1 %
EOS (ABSOLUTE): 0.2 10*3/uL (ref 0.0–0.4)
Eos: 2 %
Hematocrit: 50.2 % (ref 37.5–51.0)
Hemoglobin: 16.4 g/dL (ref 13.0–17.7)
Immature Grans (Abs): 0.1 10*3/uL (ref 0.0–0.1)
Immature Granulocytes: 1 %
Lymphocytes Absolute: 1 10*3/uL (ref 0.7–3.1)
Lymphs: 11 %
MCH: 30 pg (ref 26.6–33.0)
MCHC: 32.7 g/dL (ref 31.5–35.7)
MCV: 92 fL (ref 79–97)
Monocytes Absolute: 0.5 10*3/uL (ref 0.1–0.9)
Monocytes: 6 %
Neutrophils Absolute: 7.2 10*3/uL — ABNORMAL HIGH (ref 1.4–7.0)
Neutrophils: 79 %
Platelets: 223 10*3/uL (ref 150–450)
RBC: 5.47 x10E6/uL (ref 4.14–5.80)
RDW: 14.2 % (ref 11.6–15.4)
WBC: 9 10*3/uL (ref 3.4–10.8)

## 2023-10-01 LAB — VITAMIN D 25 HYDROXY (VIT D DEFICIENCY, FRACTURES): Vit D, 25-Hydroxy: 34.7 ng/mL (ref 30.0–100.0)

## 2023-10-01 LAB — LIPID PANEL
Chol/HDL Ratio: 3.1 ratio (ref 0.0–5.0)
Cholesterol, Total: 130 mg/dL (ref 100–199)
HDL: 42 mg/dL (ref >39)
LDL Chol Calc (NIH): 64 mg/dL (ref 0–99)
Triglycerides: 136 mg/dL (ref 0–149)
VLDL Cholesterol Cal: 24 mg/dL (ref 5–40)

## 2023-10-01 LAB — CMP14+EGFR
ALT: 31 IU/L (ref 0–44)
AST: 23 IU/L (ref 0–40)
Albumin: 4.2 g/dL (ref 3.9–4.9)
Alkaline Phosphatase: 94 IU/L (ref 44–121)
BUN/Creatinine Ratio: 14 (ref 10–24)
BUN: 21 mg/dL (ref 8–27)
Bilirubin Total: 0.3 mg/dL (ref 0.0–1.2)
CO2: 25 mmol/L (ref 20–29)
Calcium: 9 mg/dL (ref 8.6–10.2)
Chloride: 103 mmol/L (ref 96–106)
Creatinine, Ser: 1.46 mg/dL — ABNORMAL HIGH (ref 0.76–1.27)
Globulin, Total: 2.2 g/dL (ref 1.5–4.5)
Glucose: 123 mg/dL — ABNORMAL HIGH (ref 70–99)
Potassium: 4.8 mmol/L (ref 3.5–5.2)
Sodium: 141 mmol/L (ref 134–144)
Total Protein: 6.4 g/dL (ref 6.0–8.5)
eGFR: 52 mL/min/{1.73_m2} — ABNORMAL LOW (ref >59)

## 2023-10-01 LAB — TSH+FREE T4
Free T4: 1.35 ng/dL (ref 0.82–1.77)
TSH: 2.53 u[IU]/mL (ref 0.450–4.500)

## 2023-10-03 NOTE — Progress Notes (Signed)
Hello Loring,  Your lab result is normal and/or stable.Some minor variations that are not significant are commonly marked abnormal, but do not represent any medical problem for you.  Best regards, Katelynn Heidler, M.D.

## 2023-10-21 ENCOUNTER — Encounter: Payer: Self-pay | Admitting: *Deleted

## 2024-03-06 ENCOUNTER — Ambulatory Visit (INDEPENDENT_AMBULATORY_CARE_PROVIDER_SITE_OTHER): Admitting: Nurse Practitioner

## 2024-03-06 ENCOUNTER — Encounter: Payer: Self-pay | Admitting: Nurse Practitioner

## 2024-03-06 VITALS — BP 118/83 | HR 72 | Temp 98.0°F | Ht 67.5 in | Wt 229.0 lb

## 2024-03-06 DIAGNOSIS — K219 Gastro-esophageal reflux disease without esophagitis: Secondary | ICD-10-CM

## 2024-03-06 MED ORDER — OMEPRAZOLE 20 MG PO CPDR: 20.0000 mg | DELAYED_RELEASE_CAPSULE | Freq: Every day | ORAL | 0 refills | Status: AC

## 2024-03-06 NOTE — Progress Notes (Signed)
 Subjective:  Patient ID: Kyle Arellano, male    DOB: 1956/03/22, 68 y.o.   MRN: [id number]  Patient Care Team: Zollie Lowers, MD as PCP - General (Family Medicine)   Chief Complaint:  Chest Pain (Chest pain for 2 weeks mainly in morning)   HPI: ARTURO SOFRANKO is a 68 y.o. male presenting on 03/06/2024 for Chest Pain (Chest pain for 2 weeks mainly in morning)   Discussed the use of AI scribe software for clinical note transcription with the patient, who gave verbal consent to proceed.  History of Present Illness EXAVIER LINA Velinda is a 68 year old male who presents with right chest pain.  He experiences right chest pain, described as stabbing and aching, with a severity of 8 to 9 out of 10 in the morning. The pain is most severe upon waking and before bed, does not radiate to his arm or legs, and tends to subside by lunchtime, persisting mildly into the afternoon. The pain worsens when lying flat, making it difficult to get up. No history of acid reflux symptoms, but reports sour taste in his mouth when lying down and at night will have to take TUMS. Reports experiencing heavy breathing most of the time, which is not new for him. He has a history of right rib pain and recalls undergoing a chest x-ray on November 30, 2022.   He reports falling frequently, about two to three times a week, usually while walking. He has rugs in his house, which may contribute to his falls.  His diet includes a lot of junk food, minimal vegetables, and very much fruit. He typically eats his last meal around 6 or 7 PM, before going to bed at 9 PM. He consumes chocolate and soft drinks but does not eat tomatoes or drink coffee. He experiences bowel movements once or twice a night, with stools that are hard but formed, and he does not usually strain to defecate.      Relevant past medical, surgical, family, and social history reviewed and updated as indicated.  Allergies and medications reviewed and updated.  Data reviewed: Chart in Epic.   Past Medical History:  Diagnosis Date   Depression    Hyperlipidemia    Schizophrenia (HCC)     Past Surgical History:  Procedure Laterality Date   COLONOSCOPY     COLONOSCOPY WITH PROPOFOL  N/A 07/03/2022   Procedure: COLONOSCOPY WITH PROPOFOL ;  Surgeon: Eartha Angelia Sieving, MD;  Location: [facility];  Service: Gastroenterology;  Laterality: N/A;  8:00am, asa 2, does not want to come earlier   POLYPECTOMY  07/03/2022   Procedure: POLYPECTOMY;  Surgeon: Eartha Angelia, Sieving, MD;  Location: [facility];  Service: Gastroenterology;;   Right foot surgery      Social History   Socioeconomic History   Marital status: Single    Spouse name: Not on file   Number of children: Not on file   Years of education: Not on file   Highest education level: Not on file  Occupational History   Not on file  Tobacco Use   Smoking status: Former   Smokeless tobacco: Never  Vaping Use   Vaping status: Never Used  Substance and Sexual Activity   Alcohol use: Never   Drug use: Never   Sexual activity: Not on file  Other Topics Concern   Not on file  Social History Narrative   Not on file   Social Drivers of Health   Financial  Resource Strain: Not on file  Food Insecurity: Not on file  Transportation Needs: Not on file  Physical Activity: Not on file  Stress: Not on file  Social Connections: Not on file  Intimate Partner Violence: Not on file    Outpatient Encounter Medications as of 03/06/2024  Medication Sig   aspirin  EC 81 MG tablet Take 81 mg by mouth in the morning. Swallow whole.   benztropine  (COGENTIN ) 2 MG tablet Take 2 mg by mouth every other day. At bedtime.   buPROPion (WELLBUTRIN SR) 150 MG 12 hr tablet Take 150-300 mg by mouth See admin instructions. Take 2 tablets (300 mg) by mouth in the morning & take 1 tablet (150 mg) by mouth at night.   Cholecalciferol  (VITAMIN D -3 PO) Take 1 tablet by mouth in the morning.    fluPHENAZine (PROLIXIN) 10 MG tablet Take 10 mg by mouth every morning.   Multiple Vitamin (MULTIVITAMIN WITH MINERALS) TABS tablet Take 1 tablet by mouth in the morning.   omeprazole  (PRILOSEC) 20 MG capsule Take 1 capsule (20 mg total) by mouth daily.   rosuvastatin  (CRESTOR ) 10 MG tablet Take 1 tablet (10 mg total) by mouth daily.   traZODone  (DESYREL ) 100 MG tablet Take 2 tablets (200 mg total) by mouth at bedtime. for sleep.   TRINTELLIX  20 MG TABS tablet Take 20 mg by mouth every morning.   No facility-administered encounter medications on file as of 03/06/2024.    No Known Allergies  Pertinent ROS per HPI, otherwise unremarkable      Objective:  BP 118/83   Pulse 72   Temp 98 F (36.7 C) (Temporal)   Ht 5' 7.5 (1.715 m)   Wt 229 lb (103.9 kg)   SpO2 93%   BMI 35.34 kg/m    Wt Readings from Last 3 Encounters:  03/06/24 229 lb (103.9 kg)  09/30/23 219 lb (99.3 kg)  03/29/23 224 lb 3.2 oz (101.7 kg)    Physical Exam Vitals and nursing note reviewed.  HENT:     Head: Normocephalic and atraumatic.     Nose: Nose normal.  Cardiovascular:     Heart sounds: Normal heart sounds.  Pulmonary:     Breath sounds: Normal breath sounds.  Abdominal:     General: Bowel sounds are normal. There is no distension.     Palpations: Abdomen is soft.  Musculoskeletal:        General: Normal range of motion.     Right lower leg: No edema.     Left lower leg: No edema.  Skin:    General: Skin is warm and dry.     Findings: No rash.  Neurological:     Mental Status: He is alert and oriented to person, place, and time.     Comments: Slow unsteady uses a cane for ambulation  Psychiatric:        Mood and Affect: Mood normal.        Behavior: Behavior normal.        Thought Content: Thought content normal.    Physical Exam      Results for orders placed or performed in visit on 09/30/23  Bayer DCA Hb A1c Waived   Collection Time: 09/30/23 10:08 AM  Result Value Ref Range    HB A1C (BAYER DCA - WAIVED) 6.1 (H) 4.8 - 5.6 %  CBC with Differential/Platelet   Collection Time: 09/30/23 10:10 AM  Result Value Ref Range   WBC 9.0 3.4 - 10.8 x10E3/uL  RBC 5.47 4.14 - 5.80 x10E6/uL   Hemoglobin 16.4 13.0 - 17.7 g/dL   Hematocrit 49.7 62.4 - 51.0 %   MCV 92 79 - 97 fL   MCH 30.0 26.6 - 33.0 pg   MCHC 32.7 31.5 - 35.7 g/dL   RDW 85.7 88.3 - 84.5 %   Platelets 223 150 - 450 x10E3/uL   Neutrophils 79 Not Estab. %   Lymphs 11 Not Estab. %   Monocytes 6 Not Estab. %   Eos 2 Not Estab. %   Basos 1 Not Estab. %   Neutrophils Absolute 7.2 (H) 1.4 - 7.0 x10E3/uL   Lymphocytes Absolute 1.0 0.7 - 3.1 x10E3/uL   Monocytes Absolute 0.5 0.1 - 0.9 x10E3/uL   EOS (ABSOLUTE) 0.2 0.0 - 0.4 x10E3/uL   Basophils Absolute 0.1 0.0 - 0.2 x10E3/uL   Immature Granulocytes 1 Not Estab. %   Immature Grans (Abs) 0.1 0.0 - 0.1 x10E3/uL  CMP14+EGFR   Collection Time: 09/30/23 10:10 AM  Result Value Ref Range   Glucose 123 (H) 70 - 99 mg/dL   BUN 21 8 - 27 mg/dL   Creatinine, Ser 8.53 (H) 0.76 - 1.27 mg/dL   eGFR 52 (L) >40 fO/fpw/8.26   BUN/Creatinine Ratio 14 10 - 24   Sodium 141 134 - 144 mmol/L   Potassium 4.8 3.5 - 5.2 mmol/L   Chloride 103 96 - 106 mmol/L   CO2 25 20 - 29 mmol/L   Calcium  9.0 8.6 - 10.2 mg/dL   Total Protein 6.4 6.0 - 8.5 g/dL   Albumin 4.2 3.9 - 4.9 g/dL   Globulin, Total 2.2 1.5 - 4.5 g/dL   Bilirubin Total 0.3 0.0 - 1.2 mg/dL   Alkaline Phosphatase 94 44 - 121 IU/L   AST 23 0 - 40 IU/L   ALT 31 0 - 44 IU/L  Lipid panel   Collection Time: 09/30/23 10:10 AM  Result Value Ref Range   Cholesterol, Total 130 100 - 199 mg/dL   Triglycerides 863 0 - 149 mg/dL   HDL 42 >60 mg/dL   VLDL Cholesterol Cal 24 5 - 40 mg/dL   LDL Chol Calc (NIH) 64 0 - 99 mg/dL   Chol/HDL Ratio 3.1 0.0 - 5.0 ratio  TSH + free T4   Collection Time: 09/30/23 10:10 AM  Result Value Ref Range   TSH 2.530 0.450 - 4.500 uIU/mL   Free T4 1.35 0.82 - 1.77 ng/dL  VITAMIN D  25  Hydroxy (Vit-D Deficiency, Fractures)   Collection Time: 09/30/23 10:10 AM  Result Value Ref Range   Vit D, 25-Hydroxy 34.7 30.0 - 100.0 ng/mL       Pertinent labs & imaging results that were available during my care of the patient were reviewed by me and considered in my medical decision making.  Assessment & Plan:  Ercel Pepitone was seen today for chest pain.  Diagnoses and all orders for this visit:  Gastroesophageal reflux disease without esophagitis -     omeprazole  (PRILOSEC) 20 MG capsule; Take 1 capsule (20 mg total) by mouth daily.    Izrael is a 69 year old Caucasian male seen today for GERD, no acute distress Assessment and Plan Assessment & Plan Right chest pain Intermittent right chest pain, severity 8-9/10, stabbing and aching, no radiation. Previous imaging normal, no trauma history. - No x-ray needed today.  Gastroesophageal reflux disease (GERD) Symptoms suggestive of GERD, worsens when lying flat, improves by afternoon. No sour taste or GERD diagnosis. Tums ineffective. - Prescribed  omeprazole  20 mg daily, one hour before or after meals. - Advised not to eat 1-2 hours before bed. - Recommended sleeping with head elevated.  Frequent falls Falls two to three times a week, possibly due to rugs in the house. No trauma reported. - Advised removing rugs to prevent falls.  -Rx for omeprazole  0 mg sent to his pharmacy  Continue all other maintenance medications.  Follow up plan: Return for As already scheduled PCP.   Continue healthy lifestyle choices, including diet (rich in fruits, vegetables, and lean proteins, and low in salt and simple carbohydrates) and exercise (at least 30 minutes of moderate physical activity daily).  Educational handout given for  GERD in Adults: What to Know  Gastroesophageal reflux (GER) is when acid from your stomach flows up into your esophagus. Your esophagus is the part of your body that moves food from your mouth to your  stomach. Normally, food goes down and stays in your stomach to be digested. But with GER, food and stomach acid may go back up. You may have a disease called gastroesophageal reflux disease (GERD) if the reflux: Happens often. Causes very bad symptoms. Makes your esophagus sore and swollen. Over time, GERD can make small holes called ulcers in the lining of your esophagus. What are the causes? GERD is caused by a problem with the muscle between your esophagus and stomach. This muscle is called the lower esophageal sphincter (LES). When it's weak or not normal, it doesn't close like it should. This means food and stomach acid can go back up into your esophagus. The muscle can be weak if: You smoke or use products with tobacco in them. You're pregnant. You have a type of hernia called a hiatal hernia. You eat certain foods and drinks. These include: Alcohol. Coffee. Chocolate. Onions. Peppermint. What increases the risk? Being overweight. Having a disease that affects your connective tissue. Taking NSAIDs, such as ibuprofen . What are the signs or symptoms? Heartburn. Trouble swallowing. Pain when you swallow. The feeling of having a lump in your throat. A bitter taste in your mouth. Bad breath. Having an upset or bloated stomach. Burping. Chest pain. Other conditions can also cause chest pain. Make sure you see your health care provider if you have chest pain. Wheezing. This is when you make high-pitched whistling sounds when you breathe, most often when you breathe out. A long-term cough or a cough at night. How is this diagnosed? GERD may be diagnosed based on your medical history and a physical exam. You may also have tests. These may include: An endoscopy. This test looks at your stomach and esophagus with a small camera. A barium swallow test. This shows the shape and size of your esophagus and how well it's working. Tests of your esophagus to check for: Acid  levels. Pressure. How is this treated? Treatment may depend on how bad your symptoms are. It may include: Changes to your diet and daily life. Medicines. Surgery. Follow these instructions at home: Eating and drinking Follow an eating plan as told by your provider. You may need to avoid certain foods and drinks. These may include: Coffee and tea, with or without caffeine. Alcohol. Energy drinks and sports drinks. Fizzy drinks or sodas. Chocolate and cocoa. Peppermint and mint flavorings. Garlic and onions. Horseradish. Spicy and acidic foods. These include: Peppers. Chili powder and curry powder. Vinegar. Hot sauces and BBQ sauce. Citrus fruits and juices. These include: Oranges. Lemons. Limes. Tomato-based foods. These include: Red sauce and pizza  with red sauce. Chili. Salsa. Fried and fatty foods. These include: Donuts. [place] fries. Potato chips. High-fat dressings. High-fat meats. These include: Hot dogs and sausage. Rib eye steak. Ham and bacon. High-fat dairy items. These include: Whole milk. Butter. Cream cheese. Eat small meals often. Avoid eating big meals. Avoid drinking lots of liquid with your meals. Try not to eat meals during the 2-3 hours before bedtime. Try not to lie down right after you eat. Do not exercise right after you eat. Lifestyle  If you're overweight, lose an amount of weight that's healthy for you. Ask your provider about a safe weight loss goal. Do not smoke, vape, or use nicotine or tobacco. Wear loose clothes. Do not wear things that are tight around your waist. When you sleep, try: Raising the head of your bed about 6 inches (15 cm). You can use a wedge to do this. Lying down on your left side. Try to lower your stress. If you need help doing this, ask your provider. General instructions Take your medicines only as told. Do not take aspirin  or ibuprofen  unless you're told to. Watch for any changes in your symptoms. Do not  bend over if it makes your symptoms worse. Contact a health care provider if: You have new symptoms. You have trouble: Drinking. Swallowing. Eating. It hurts to swallow. You have wheezing. You have a cough that won't go away. Your voice is hoarse. Your symptoms don't get better with treatment. Get help right away if: You have pain all of a sudden in your: Arm. Neck. Jaw. Teeth. Back. You feel sweaty, dizzy, or light-headed all of a sudden. You faint. You have chest pain or shortness of breath. You vomit and the vomit is: Green, yellow, or black. Looks like blood or coffee grounds. Your poop is red, bloody, or black. These symptoms may be an emergency. Call 911 right away. Do not wait to see if the symptoms will go away. Do not drive yourself to the hospital. This information is not intended to replace advice given to you by your health care provider. Make sure you discuss any questions you have with your health care provider. Document Revised: 04/20/2023 Document Reviewed: 11/04/2022 Elsevier Patient Education  2024 Elsevier Inc. GERD in Adults: Diet Changes When you have gastroesophageal reflux disease (GERD), you may need to make changes to your diet. Choosing the right foods can help with your symptoms. Think about working with an expert in healthy eating called a dietitian. They can help you make healthy food choices. What are tips for following this plan? Reading food labels Look for foods that are low in saturated fat. Foods that may help with your symptoms include: Foods with less than 5% of daily value (DV) of fat. Foods with 0 grams of trans fat. Cooking [employer] in ways that don't use a lot of fat. These ways include: Baking. Steaming. Grilling. Broiling. To add flavor, try to use herbs that are low in spice and acidity. Avoid frying your food. Meal planning  Eat small meals often rather than eating 3 large meals each day. Eat your meals slowly in a  place where you feel relaxed. If told by your health care provider, avoid: Foods that cause symptoms. Keep a food diary to keep track of foods that cause symptoms. Alcohol. Drinking a lot of liquid with meals. General instructions For 2-3 hours after you eat, avoid: Bending over. Exercise. Lying down. Chew sugar-free gum after meals. What foods should I eat? Eat  a healthy diet. Try to include: Foods with high amounts of fiber. These include: Fruits and vegetables. Whole grains and beans. Low-fat dairy products. Lean meats, fish, and poultry. Egg whites. Foods that cause symptoms in someone else may not cause symptoms for you. Work with your provider to find foods that are safe for you. The items listed above may not be all the foods and drinks you can have. Talk with a dietitian to learn more. The items listed above may not be a complete list of foods and beverages you can eat and drink. Contact a dietitian for more information. What foods should I avoid? Limiting some of these foods may help with your symptoms. Each person is different. Talk with a dietitian or your provider to help you find the exact foods to avoid. Some of the foods to avoid may include: Fruits Fruits with a lot of acid in them. These may include citrus fruits, such as oranges, grapefruit, pineapple, and lemons. Vegetables Deep-fried vegetables, such as [place] fries. Vegetables, sauces, or toppings made with added fat and vegetables with acid in them. These may include tomatoes and tomato products, chili peppers, onions, garlic, and horseradish. Grains Pastries or quick breads with added fat. Meats and other proteins High-fat meats, such as fatty beef or pork, hot dogs, ribs, ham, sausage, salami, and bacon. Fried meat or protein, such as fried fish and fried chicken. Egg yolks. Fats and oils Butter. Margarine. Shortening. Ghee. Drinks Coffee and other drinks with caffeine in them. Fizzy and sugary drinks,  such as soda and energy drinks. Fruit juice made with acidic fruits, such as orange or grapefruit. Tomato juice. Sweets and desserts Chocolate and cocoa. Donuts. Seasonings and condiments Mint, such as peppermint and spearmint. Condiments, herbs, or seasonings that cause symptoms. These may include curry, hot sauce, or vinegar-based salad dressings. The items listed above may not be all the foods and drinks you should avoid. Talk with a dietitian to learn more. Questions to ask your health care provider Changes to your diet and everyday life are often the first steps taken to manage symptoms of GERD. If these changes don't help, talk with your provider about taking medicines. Where to find more information [employer] G[employer]intestinal [employer]: aboutgerd.org This information is not intended to replace advice given to you by your health care provider. Make sure you discuss any questions you have with your health care provider. Document Revised: 04/20/2023 Document Reviewed: 11/04/2022 Elsevier Patient Education  2024 Elsevier Inc. paper filled by  The above assessment and management plan was discussed with the patient. The patient verbalized understanding of and has agreed to the management plan. Patient is aware to call the clinic if they develop any new symptoms or if symptoms persist or worsen. Patient is aware when to return to the clinic for a follow-up visit. Patient educated on when it is appropriate to go to the emergency department.  Nicolaos Mitrano [place] Thompson, DNP [facility] [address] [place], [place] [address] [phone]

## 2024-03-27 ENCOUNTER — Other Ambulatory Visit: Payer: Self-pay | Admitting: Family Medicine

## 2024-03-27 ENCOUNTER — Ambulatory Visit: Admitting: Family Medicine

## 2024-03-27 ENCOUNTER — Encounter: Payer: Self-pay | Admitting: Family Medicine

## 2024-03-27 VITALS — BP 118/79 | HR 68 | Temp 98.3°F | Ht 67.5 in | Wt 227.2 lb

## 2024-03-27 DIAGNOSIS — R7303 Prediabetes: Secondary | ICD-10-CM | POA: Diagnosis not present

## 2024-03-27 DIAGNOSIS — Z125 Encounter for screening for malignant neoplasm of prostate: Secondary | ICD-10-CM | POA: Diagnosis not present

## 2024-03-27 DIAGNOSIS — E782 Mixed hyperlipidemia: Secondary | ICD-10-CM | POA: Diagnosis not present

## 2024-03-27 DIAGNOSIS — F331 Major depressive disorder, recurrent, moderate: Secondary | ICD-10-CM

## 2024-03-27 LAB — LIPID PANEL

## 2024-03-27 LAB — BAYER DCA HB A1C WAIVED: HB A1C (BAYER DCA - WAIVED): 6.1 % — ABNORMAL HIGH (ref 4.8–5.6)

## 2024-03-27 NOTE — Progress Notes (Signed)
 Subjective:  Patient ID: Kyle Arellano, male    DOB: Apr 07, 1956  Age: 68 y.o. MRN: [id number]  CC: Medical Management of Chronic Issues   HPI  Discussed the use of AI scribe software for clinical note transcription with the patient, who gave verbal consent to proceed.  History of Present Illness Kyle Arellano Kyle Arellano is a 68 year old male who presents for follow-up of chest pain and medication management.  He experiences chest pain primarily on the right side, described as slight discomfort occurring occasionally. Previously, he had more severe, stabbing pain on the left side, which raised concerns for a heart attack. The pain has since shifted back to the right side and is now mild. He has been taking medication for acid reflux, which seems to be helping with the chest pain. He denies recent shortness of breath.  He is currently taking fluphenazine and Proleukin, which help him feel 'pretty good.' He is also under the care of a psychiatrist and takes antidepressants for depression, which has reportedly worsened slightly. He is being monitored for prediabetes, with recent tests indicating he is in the prediabetic range. He takes cholesterol medication and trazodone  for sleep. He reports sleeping 'too much,' which he attributes to his medications.  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 His parents are deceased, and he lives alone.          03/27/2024   10:03 AM 09/30/2023   10:06 AM 03/29/2023   11:18 AM  Depression screen PHQ 2/9  Decreased Interest 1 0 0  Down, Depressed, Hopeless 1 1 1   PHQ - 2 Score 2 1 1   Altered sleeping 1 0 0  Tired, decreased energy 1 3 1   Change in appetite 3 0 0  Feeling bad or failure about yourself  1 0 0  Trouble concentrating 3  1  Moving slowly or fidgety/restless 2 3 1   Suicidal thoughts 0 0 0  PHQ-9 Score 13 7 4   Difficult doing work/chores Somewhat difficult Somewhat difficult Not difficult  at all    History Kyle Arellano has a past medical history of Depression, Hyperlipidemia, and Schizophrenia (HCC).   He has a past surgical history that includes Right foot surgery; Colonoscopy; Colonoscopy with propofol  (N/A, 07/03/2022); and polypectomy (07/03/2022).   His family history includes Cancer in his father; Cirrhosis in his father; Diabetes in his father; Hypertension in his mother.He reports that he has quit smoking. He has never used smokeless tobacco. He reports that he does not drink alcohol and does not use drugs.    ROS Review of Systems  Constitutional:  Negative for fever.  Respiratory:  Negative for shortness of breath.   Cardiovascular:  Negative for chest pain.  Musculoskeletal:  Negative for arthralgias.  Skin:  Negative for rash.    Objective:  BP 118/79   Pulse 68   Temp 98.3 F (36.8 C)   Ht 5' 7.5 (1.715 m)   Wt 227 lb 3.2 oz (103.1 kg)   SpO2 96%   BMI 35.06 kg/m   BP Readings from Last 3 Encounters:  03/27/24 118/79  03/06/24 118/83  09/30/23 106/70    Wt Readings from Last 3 Encounters:  03/27/24 227 lb 3.2 oz (103.1 kg)  03/06/24 229 lb (103.9 kg)  09/30/23 219 lb (99.3 kg)     Physical Exam Physical Exam GENERAL: Alert, cooperative, well developed, no acute distress. HEENT: Normocephalic, normal oropharynx, moist mucous membranes. CHEST: Clear to  auscultation bilaterally, no wheezes, rhonchi, or crackles. CARDIOVASCULAR: Normal heart rate and rhythm, S1 and S2 normal without murmurs. ABDOMEN: Soft, non-tender, non-distended, without organomegaly, normal bowel sounds. EXTREMITIES: No cyanosis or edema. NEUROLOGICAL: Cranial nerves grossly intact, moves all extremities without gross motor or sensory deficit.   Assessment & Plan:  Mixed hyperlipidemia -     Lipid panel  Pre-diabetes -     CMP14+EGFR -     CBC with Differential/Platelet -     Bayer DCA Hb A1c Waived  Screening PSA (prostate specific antigen) -     PSA, total and  free  Moderate episode of recurrent major depressive disorder (HCC)    Assessment and Plan Assessment & Plan Depression   Depression has worsened according to a recent survey. He is under psychiatric care and taking antidepressants. Continue current psychiatric care and antidepressant regimen.  Insomnia   He is taking trazodone  for sleep but reports hypersomnia, possibly due to medication. Continue trazodone  for sleep.  Gastroesophageal reflux disease (GERD)   He experiences intermittent slight discomfort in the right chest, with previous severe stabbing pain now improved with medication. Continue current medication for GERD and reassess in three months if symptoms persist.  Mixed hyperlipidemia   He is on cholesterol medication and awaiting test results for cholesterol levels. Continue current cholesterol medication. Review cholesterol test results on MyChart and contact if there are any issues.  Prediabetes   Blood test indicates prediabetes, with no true diabetes at this point. He is at risk of developing diabetes. Monitor blood glucose levels every six months to ensure diabetes has not developed.       Follow-up: Return in about 6 months (around 09/25/2024) for [employer] physical.  Kyle Arellano Der, M.D.

## 2024-03-28 ENCOUNTER — Ambulatory Visit: Payer: Self-pay | Admitting: Family Medicine

## 2024-03-28 LAB — LIPID PANEL
Cholesterol, Total: 132 mg/dL (ref 100–199)
HDL: 40 mg/dL (ref >39)
LDL CALC COMMENT:: 3.3 ratio (ref 0.0–5.0)
LDL Chol Calc (NIH): 66 mg/dL (ref 0–99)
Triglycerides: 151 mg/dL — AB (ref 0–149)
VLDL Cholesterol Cal: 26 mg/dL (ref 5–40)

## 2024-03-28 LAB — CBC WITH DIFFERENTIAL/PLATELET
Basophils Absolute: 0.1 x10E3/uL (ref 0.0–0.2)
Basos: 2 %
EOS (ABSOLUTE): 0.1 x10E3/uL (ref 0.0–0.4)
Eos: 2 %
Hematocrit: 48.8 % (ref 37.5–51.0)
Hemoglobin: 15.8 g/dL (ref 13.0–17.7)
Immature Grans (Abs): 0.1 x10E3/uL (ref 0.0–0.1)
Immature Granulocytes: 1 %
Lymphocytes Absolute: 1 x10E3/uL (ref 0.7–3.1)
Lymphs: 14 %
MCH: 30 pg (ref 26.6–33.0)
MCHC: 32.4 g/dL (ref 31.5–35.7)
MCV: 93 fL (ref 79–97)
Monocytes Absolute: 0.4 x10E3/uL (ref 0.1–0.9)
Monocytes: 6 %
Neutrophils Absolute: 5.6 x10E3/uL (ref 1.4–7.0)
Neutrophils: 75 %
Platelets: 197 x10E3/uL (ref 150–450)
RBC: 5.27 x10E6/uL (ref 4.14–5.80)
RDW: 14 % (ref 11.6–15.4)
WBC: 7.3 x10E3/uL (ref 3.4–10.8)

## 2024-03-28 LAB — CMP14+EGFR
ALT: 20 IU/L (ref 0–44)
AST: 20 IU/L (ref 0–40)
Albumin: 4.4 g/dL (ref 3.9–4.9)
Alkaline Phosphatase: 101 IU/L (ref 47–123)
BUN/Creatinine Ratio: 15 (ref 10–24)
BUN: 21 mg/dL (ref 8–27)
Bilirubin Total: 0.3 mg/dL (ref 0.0–1.2)
CO2: 22 mmol/L (ref 20–29)
Calcium: 9 mg/dL (ref 8.6–10.2)
Chloride: 101 mmol/L (ref 96–106)
Creatinine, Ser: 1.41 mg/dL — AB (ref 0.76–1.27)
Globulin, Total: 2 g/dL (ref 1.5–4.5)
Glucose: 123 mg/dL — AB (ref 70–99)
Potassium: 4.9 mmol/L (ref 3.5–5.2)
Sodium: 139 mmol/L (ref 134–144)
Total Protein: 6.4 g/dL (ref 6.0–8.5)
eGFR: 55 mL/min/1.73 — AB (ref >59)

## 2024-03-28 LAB — PSA, TOTAL AND FREE
PSA, Free Pct: 40 %
PSA, Free: 0.08 ng/mL
Prostate Specific Ag, Serum: 0.2 ng/mL (ref 0.0–4.0)

## 2024-03-28 NOTE — Progress Notes (Signed)
Hello Loring,  Your lab result is normal and/or stable.Some minor variations that are not significant are commonly marked abnormal, but do not represent any medical problem for you.  Best regards, Katelynn Heidler, M.D.

## 2024-03-29 ENCOUNTER — Ambulatory Visit: Admitting: Family Medicine

## 2024-04-26 ENCOUNTER — Encounter (INDEPENDENT_AMBULATORY_CARE_PROVIDER_SITE_OTHER): Payer: Self-pay | Admitting: Gastroenterology

## 2024-06-19 ENCOUNTER — Emergency Department (HOSPITAL_COMMUNITY)
Admission: EM | Admit: 2024-06-19 | Discharge: 2024-06-19 | Disposition: A | Discharge status: Home / Self Care | Source: Ambulatory Visit

## 2024-06-19 ENCOUNTER — Other Ambulatory Visit: Payer: Self-pay

## 2024-06-19 ENCOUNTER — Encounter (HOSPITAL_COMMUNITY): Payer: Self-pay | Admitting: *Deleted

## 2024-06-19 ENCOUNTER — Emergency Department (HOSPITAL_COMMUNITY)

## 2024-06-19 DIAGNOSIS — S3013XA Contusion of flank (latus) region, initial encounter: Secondary | ICD-10-CM | POA: Diagnosis not present

## 2024-06-19 DIAGNOSIS — K625 Hemorrhage of anus and rectum: Secondary | ICD-10-CM | POA: Insufficient documentation

## 2024-06-19 DIAGNOSIS — S20211A Contusion of right front wall of thorax, initial encounter: Secondary | ICD-10-CM | POA: Diagnosis not present

## 2024-06-19 DIAGNOSIS — R0789 Other chest pain: Secondary | ICD-10-CM

## 2024-06-19 DIAGNOSIS — Z7982 Long term (current) use of aspirin: Secondary | ICD-10-CM | POA: Diagnosis not present

## 2024-06-19 DIAGNOSIS — W19XXXA Unspecified fall, initial encounter: Secondary | ICD-10-CM | POA: Insufficient documentation

## 2024-06-19 DIAGNOSIS — R0781 Pleurodynia: Secondary | ICD-10-CM | POA: Diagnosis present

## 2024-06-19 LAB — COMPREHENSIVE METABOLIC PANEL WITH GFR
ALT: 28 U/L (ref 0–44)
AST: 26 U/L (ref 15–41)
Albumin: 4 g/dL (ref 3.5–5.0)
Alkaline Phosphatase: 93 U/L (ref 38–126)
Anion gap: 14 (ref 5–15)
BUN: 18 mg/dL (ref 8–23)
CO2: 22 mmol/L (ref 22–32)
Calcium: 8.9 mg/dL (ref 8.9–10.3)
Chloride: 105 mmol/L (ref 98–111)
Creatinine, Ser: 1.12 mg/dL (ref 0.61–1.24)
GFR, Estimated: >60 mL/min
Glucose, Bld: 98 mg/dL (ref 70–99)
Potassium: 4 mmol/L (ref 3.5–5.1)
Sodium: 141 mmol/L (ref 135–145)
Total Bilirubin: 0.4 mg/dL (ref 0.0–1.2)
Total Protein: 6.6 g/dL (ref 6.5–8.1)

## 2024-06-19 LAB — TYPE AND SCREEN
ABO/RH(D): O POS
Antibody Screen: NEGATIVE

## 2024-06-19 LAB — CBC WITH DIFFERENTIAL/PLATELET
Abs Immature Granulocytes: 0.1 K/uL — ABNORMAL HIGH (ref 0.00–0.07)
Basophils Absolute: 0.1 K/uL (ref 0.0–0.1)
Basophils Relative: 1 %
Eosinophils Absolute: 0.3 K/uL (ref 0.0–0.5)
Eosinophils Relative: 4 %
HCT: 41.6 % (ref 39.0–52.0)
Hemoglobin: 13.8 g/dL (ref 13.0–17.0)
Immature Granulocytes: 1 %
Lymphocytes Relative: 13 %
Lymphs Abs: 1.2 K/uL (ref 0.7–4.0)
MCH: 29.6 pg (ref 26.0–34.0)
MCHC: 33.2 g/dL (ref 30.0–36.0)
MCV: 89.3 fL (ref 80.0–100.0)
Monocytes Absolute: 0.6 K/uL (ref 0.1–1.0)
Monocytes Relative: 7 %
Neutro Abs: 6.6 K/uL (ref 1.7–7.7)
Neutrophils Relative %: 74 %
Platelets: 308 K/uL (ref 150–400)
RBC: 4.66 MIL/uL (ref 4.22–5.81)
RDW: 14.7 % (ref 11.5–15.5)
WBC: 8.9 K/uL (ref 4.0–10.5)
nRBC: 0 % (ref 0.0–0.2)

## 2024-06-19 NOTE — ED Provider Notes (Signed)
 " [place] [facility] DE[facility]MENT AT [facility] Provider Note   CSN: [id number] Arrival date & time: 06/19/24  1522     Patient presents with: Rectal Bleeding   VINEETH FELL is a 68 y.o. male.   68 year old male presents for evaluation of rectal bleeding.  He had a fall before Christmas and was seen in the ER diagnosed with 2 rib fractures.  States shortly after that he developed some rectal bleeding.  States it has slowed down.  He is not on any blood thinners.  States it was initially bright red.  He states he is otherwise feeling well just having pain from his ribs and his right side.  Denies any other symptoms or concerns at this time.   Rectal Bleeding Associated symptoms: no abdominal pain, no fever and no vomiting        Prior to Admission medications  Medication Sig Start Date End Date Taking? Authorizing Provider  aspirin  EC 81 MG tablet Take 81 mg by mouth in the morning. Swallow whole.    [provider]  benztropine  (COGENTIN ) 2 MG tablet Take 2 mg by mouth every other day. At bedtime.    [provider]  buPROPion (WELLBUTRIN SR) 150 MG 12 hr tablet Take 150-300 mg by mouth See admin instructions. Take 2 tablets (300 mg) by mouth in the morning & take 1 tablet (150 mg) by mouth at night. 01/21/22   [provider]  Cholecalciferol  (VITAMIN D -3 PO) Take 1 tablet by mouth in the morning.    [provider]  fluPHENAZine (PROLIXIN) 10 MG tablet Take 10 mg by mouth every morning. 02/13/22   [provider]  Multiple Vitamin (MULTIVITAMIN WITH MINERALS) TABS tablet Take 1 tablet by mouth in the morning.    [provider]  omeprazole  (PRILOSEC) 20 MG capsule Take 1 capsule (20 mg total) by mouth daily. 03/06/24   [facility] Pool, NP  rosuvastatin  (CRESTOR ) 10 MG tablet TAKE ONE TABLET BY MOUTH DAILY 03/27/24   Zollie Lowers, MD  traZODone  (DESYREL ) 100 MG tablet Take 2 tablets (200 mg total) by mouth at  bedtime. for sleep. 09/30/23   Zollie Lowers, MD  TRINTELLIX  20 MG TABS tablet Take 20 mg by mouth every morning. 09/08/21   [provider]    Allergies: Patient has no known allergies.    Review of Systems  Constitutional:  Negative for chills and fever.  HENT:  Negative for ear pain and sore throat.   Eyes:  Negative for pain and visual disturbance.  Respiratory:  Negative for cough and shortness of breath.   Cardiovascular:  Negative for chest pain and palpitations.  Gastrointestinal:  Positive for blood in stool and hematochezia. Negative for abdominal pain and vomiting.  Genitourinary:  Negative for dysuria and hematuria.  Musculoskeletal:  Negative for arthralgias and back pain.  Skin:  Negative for color change and rash.  Neurological:  Negative for seizures and syncope.  All other systems reviewed and are negative.   Updated Vital Signs BP (!) 140/78 (BP Location: Right Arm)   Pulse 82   Temp 97.9 F (36.6 C) (Oral)   Resp 17   Ht 5' 7.5 (1.715 m)   Wt 103.9 kg   SpO2 97%   BMI 35.34 kg/m   Physical Exam Vitals and nursing note reviewed.  Constitutional:      General: He is not in acute distress.    Appearance: Normal appearance. He is well-developed. He is obese.  He is not ill-appearing.  HENT:     Head: Normocephalic and atraumatic.  Eyes:     Conjunctiva/sclera: Conjunctivae normal.  Cardiovascular:     Rate and Rhythm: Normal rate and regular rhythm.     Heart sounds: No murmur heard. Pulmonary:     Effort: Pulmonary effort is normal. No respiratory distress.     Breath sounds: Normal breath sounds.     Comments: There is ecchymosis over the right lateral chest wall with tenderness to palpation Abdominal:     General: There is no distension.     Palpations: Abdomen is soft.     Tenderness: There is no abdominal tenderness.     Comments: There is ecchymosis over the right upper flank  Musculoskeletal:        General: No swelling.      Cervical back: Neck supple.  Skin:    General: Skin is warm and dry.     Capillary Refill: Capillary refill takes less than 2 seconds.  Neurological:     Mental Status: He is alert.  Psychiatric:        Mood and Affect: Mood normal.     (all labs ordered are listed, but only abnormal results are displayed) Labs Reviewed  CBC WITH DIFFERENTIAL/PLATELET - Abnormal; Notable for the following components:      Result Value   Abs Immature Granulocytes 0.10 (*)    All other components within normal limits  COMPREHENSIVE METABOLIC PANEL WITH GFR  POC OCCULT BLOOD, ED  TYPE AND SCREEN    EKG: None  Radiology: DG Ribs Unilateral W/Chest Right Result Date: 06/19/2024 EXAM: 1 VIEW XRAY OF THE RIGHT RIBS AND AP CHEST 06/19/2024 04:20:03 PM COMPARISON: Chest x-ray 11/30/2022. CLINICAL HISTORY: rib pain FINDINGS: BONES: No acute displaced rib fracture. LUNGS AND PLEURA: BB marker overlying the right lower chest. No consolidation or pulmonary edema. No pleural effusion or pneumothorax. HEART AND MEDIASTINUM: No acute abnormality of the cardiac and mediastinal silhouettes. IMPRESSION: 1. No acute rib fracture.Please note, nondisplaced rib fracture may be occult on radiograph and is not excluded. 2. No acute process in the lungs. Electronically signed by: Morgane Naveau MD 06/19/2024 06:22 PM EST RP Workstation: [id number]     Procedures   Medications Ordered in the ED - No data to display                                  Medical Decision Making   Patient here for rectal bleeding.  States it slowed down since his initially started a few days ago after a fall.  Was seen at outside ER prior to this and diagnosed with rib fractures.  His hemoglobin is within normal limits and labs are stable.  Vitals are stable and he overall appears well except he does have some ecchymosis over his right flank and ribs.  I recommended CT scan of his abdomen, but he declined CT scan at this time states he would  like to follow-up with primary care or return if he changes his mind.  He is aware of the risks of not getting the CT scan including worsening of his condition.  Chest x-ray showed no evidence of abnormality today either and no evidence of rib fractures.  Advised Tylenol  as needed for pain and given strict return precautions.  Problems Addressed: Fall, initial encounter: chronic illness or injury Rectal bleeding: undiagnosed new problem with uncertain prognosis Rib pain: chronic  illness or injury  Amount and/or Complexity of Data Reviewed External Data Reviewed: notes.    Details: Prior ED records reviewed and patient seen 06-10-2024 for fall at outside ER and diagnosed with rib fractures Labs: ordered. Decision-making details documented in ED Course.    Details: Ordered and reviewed by me and unremarkable Radiology: ordered and independent interpretation performed. Decision-making details documented in ED Course.    Details: Ordered and interpreted by me independently of radiology Right rib and chest x-ray: Shows no acute abnormality  Risk OTC drugs. Prescription drug management. Diagnosis or treatment significantly limited by social determinants of health.     Final diagnoses:  Rectal bleeding  Rib pain  Fall, initial encounter    ED Discharge Orders     None          Gennaro Duwaine CROME, DO 06/19/24 1944  "

## 2024-06-19 NOTE — Discharge Instructions (Addendum)
 You can call your primary doctor tomorrow and follow-up with them.  You can return to the ER if you change your mind about getting a CT scan.  Your blood counts were stable, however you declined a CT scan while in the emergency department.  If your symptoms worsen or you change your mind you may return.

## 2024-06-19 NOTE — ED Triage Notes (Signed)
 Pt with rectal bleeding, pt states started after his fall on 12/20. C/o pain to right ribs as well.

## 2024-07-03 ENCOUNTER — Ambulatory Visit: Admitting: Nurse Practitioner

## 2024-07-03 ENCOUNTER — Encounter: Payer: Self-pay | Admitting: Nurse Practitioner

## 2024-07-03 VITALS — BP 132/77 | HR 72 | Temp 96.7°F | Ht 67.0 in | Wt 221.0 lb

## 2024-07-03 DIAGNOSIS — K625 Hemorrhage of anus and rectum: Secondary | ICD-10-CM

## 2024-07-03 DIAGNOSIS — Z09 Encounter for follow-up examination after completed treatment for conditions other than malignant neoplasm: Secondary | ICD-10-CM

## 2024-07-03 NOTE — Patient Instructions (Signed)
Rectal Bleeding  Rectal bleeding is when blood passes out of the opening of the butt (anus). If you have rectal bleeding, you may see bright red blood in your underwear or in the toilet after you poop. You may also have blood mixed with your poop (stool) or dark red or black poop. Rectal bleeding is often a sign that something is wrong. Causes of rectal bleeding include: Diverticulosis. This is when sacs or pockets form in the large intestine (colon). Hemorrhoids. These are blood vessels around the anus or inside the rectum that are larger than normal. Anal fissures. This is a tear in the anus. Proctitis and colitis. These are conditions that happen when the rectum, colon, or anus becomes inflamed. Polyps. These are growths. They may be cancer (malignant) or not cancer (benign). Infections of the intestines. Fistulas. These are abnormal openings in the rectum and anus. Rectal prolapse. This is when a part of the rectum sticks out from the anus. Follow these instructions at home: Medicines Take over-the-counter and prescription medicines only as told by your health care provider. Ask your provider about changing or stopping your regular medicines. These include any diabetes medicine or blood thinners you take. Medicines that thin the blood can make rectal bleeding worse. Managing constipation Your condition may cause constipation. To prevent or treat constipation, or to help make your poop soft, you may need to: Drink enough fluid to keep your pee (urine) pale yellow. Take over-the-counter or prescription medicines. Eat foods that are high in fiber, such as beans, whole grains, and fresh fruits and vegetables. Ask your provider if you need a fiber supplement. Limit foods that are high in fat and processed sugars, such as fried or sweet foods.  General instructions Try not to strain when you poop. Take a warm bath. This may help to soothe pain in your rectum. Watch for any changes in your  symptoms. Contact a health care provider if: You have pain or tenderness in your abdomen. You have a fever. You feel weak or nauseous. You cannot poop. You have new or more rectal bleeding. You have black or dark red poop. You vomit, and the vomit has blood or something that looks like coffee grounds in it. Get help right away if: You faint. You have severe pain in your rectum. These symptoms may be an emergency. Get help right away. Call 911. Do not wait to see if the symptoms will go away. Do not drive yourself to the hospital. This information is not intended to replace advice given to you by your health care provider. Make sure you discuss any questions you have with your health care provider. Document Revised: 01/27/2022 Document Reviewed: 01/27/2022 Elsevier Patient Education  2024 [employer].

## 2024-07-03 NOTE — Progress Notes (Signed)
 "  Subjective:    Patient ID: Kyle Arellano, male    DOB: Jun 30, 1955, 69 y.o.   MRN: [id number]   Chief Complaint: ED follow up  HPI  Patient went to the ED  on 06/19/24 c/o rectal bleeding. States it has slowed down. He is not on any blood thinners. States it was initially bright red. Hgb was 13.8. he denies any more rectal bleeding in over a week. Denies any abdominal pain . Patient Active Problem List   Diagnosis Date Noted   Pre-diabetes 09/30/2023   Impairment of balance 12/15/2022   History of colonic polyps 07/03/2022   Other insomnia 04/04/2022   Language difficulty 11/13/2021   Speech problem 09/21/2021   HLD (hyperlipidemia) 09/21/2021   Anxiety state 09/28/2007   Depression, schizophrenia 09/28/2007   PANIC DISORDER, HX OF 09/28/2007   COLONIC POLYPS, ADENOMATOUS 02/17/2007   Diverticulosis of colon 02/17/2007       Review of Systems  Constitutional:  Negative for diaphoresis.  Eyes:  Negative for pain.  Respiratory:  Negative for shortness of breath.   Cardiovascular:  Negative for chest pain, palpitations and leg swelling.  Gastrointestinal:  Negative for abdominal pain.  Endocrine: Negative for polydipsia.  Skin:  Negative for rash.  Neurological:  Negative for dizziness, weakness and headaches.  Hematological:  Does not bruise/bleed easily.  All other systems reviewed and are negative.      Objective:   Physical Exam Vitals and nursing note reviewed.  Constitutional:      Appearance: Normal appearance. He is well-developed.  Neck:     Thyroid : No thyroid  mass or thyromegaly.     Vascular: No carotid bruit or JVD.     Trachea: Phonation normal.  Cardiovascular:     Rate and Rhythm: Normal rate and regular rhythm.  Pulmonary:     Effort: Pulmonary effort is normal. No respiratory distress.     Breath sounds: Normal breath sounds.  Abdominal:     General: Bowel sounds are normal.     Palpations: Abdomen is soft.     Tenderness: There is no  abdominal tenderness.  Musculoskeletal:        General: Normal range of motion.     Cervical back: Normal range of motion and neck supple.  Lymphadenopathy:     Cervical: No cervical adenopathy.  Skin:    General: Skin is warm and dry.  Neurological:     Mental Status: He is alert and oriented to person, place, and time.  Psychiatric:        Behavior: Behavior normal.        Thought Content: Thought content normal.        Judgment: Judgment normal.     BP 132/77   Pulse 72   Temp (!) 96.7 F (35.9 C) (Temporal)   Ht 5' 7 (1.702 m)   Wt 221 lb (100.2 kg)   SpO2 95%   BMI 34.61 kg/m        Assessment & Plan:  Kyle Arellano in today with chief complaint of Hospitalization Follow-up   1. Rectal bleeding (Primary) Report any new rectal bleeding  2. Hospital discharge follow-up Hospital records reviewed    The above assessment and management plan was discussed with the patient. The patient verbalized understanding of and has agreed to the management plan. Patient is aware to call the clinic if symptoms persist or worsen. Patient is aware when to return to the clinic for a follow-up visit. Patient educated on when  it is appropriate to go to the emergency department.   Mary-Margaret Gladis, FNP    "

## 2024-09-26 ENCOUNTER — Encounter: Payer: Self-pay | Admitting: Family Medicine
